# Patient Record
Sex: Female | Born: 1965
Health system: Southern US, Community
[De-identification: ages and names within clinical notes are randomized; demographics above are authoritative.]

## PROBLEM LIST (undated history)

## (undated) DIAGNOSIS — D649 Anemia, unspecified: Secondary | ICD-10-CM

## (undated) DIAGNOSIS — R519 Headache, unspecified: Secondary | ICD-10-CM

## (undated) DIAGNOSIS — R51 Headache: Secondary | ICD-10-CM

## (undated) DIAGNOSIS — R7303 Prediabetes: Secondary | ICD-10-CM

## (undated) DIAGNOSIS — Z86018 Personal history of other benign neoplasm: Secondary | ICD-10-CM

## (undated) DIAGNOSIS — F419 Anxiety disorder, unspecified: Secondary | ICD-10-CM

## (undated) DIAGNOSIS — Z800 Family history of malignant neoplasm of digestive organs: Secondary | ICD-10-CM

## (undated) DIAGNOSIS — M1990 Unspecified osteoarthritis, unspecified site: Secondary | ICD-10-CM

## (undated) DIAGNOSIS — I8390 Asymptomatic varicose veins of unspecified lower extremity: Secondary | ICD-10-CM

## (undated) DIAGNOSIS — I10 Essential (primary) hypertension: Secondary | ICD-10-CM

## (undated) DIAGNOSIS — E119 Type 2 diabetes mellitus without complications: Secondary | ICD-10-CM

## (undated) DIAGNOSIS — R002 Palpitations: Secondary | ICD-10-CM

## (undated) HISTORY — DX: Type 2 diabetes mellitus without complications: E11.9

## (undated) HISTORY — DX: Prediabetes: R73.03

## (undated) HISTORY — PX: ABLATION SAPHENOUS VEIN W/ RFA: SUR11

## (undated) HISTORY — DX: Personal history of other benign neoplasm: Z86.018

## (undated) HISTORY — DX: Palpitations: R00.2

## (undated) HISTORY — PX: OTHER SURGICAL HISTORY: SHX169

## (undated) HISTORY — DX: Family history of malignant neoplasm of digestive organs: Z80.0

---

## 1996-10-05 HISTORY — PX: TUBAL LIGATION: SHX77

## 1999-10-06 HISTORY — PX: MOUTH SURGERY: SHX715

## 2014-08-21 ENCOUNTER — Encounter: Payer: Self-pay | Admitting: Podiatry

## 2014-08-21 ENCOUNTER — Ambulatory Visit (INDEPENDENT_AMBULATORY_CARE_PROVIDER_SITE_OTHER): Payer: 59 | Admitting: Podiatry

## 2014-08-21 ENCOUNTER — Ambulatory Visit (INDEPENDENT_AMBULATORY_CARE_PROVIDER_SITE_OTHER): Payer: 59

## 2014-08-21 ENCOUNTER — Ambulatory Visit: Payer: Self-pay | Admitting: Nurse Practitioner

## 2014-08-21 VITALS — BP 142/89 | HR 71 | Resp 16 | Ht 65.0 in | Wt 198.0 lb

## 2014-08-21 DIAGNOSIS — M779 Enthesopathy, unspecified: Secondary | ICD-10-CM

## 2014-08-21 DIAGNOSIS — M76829 Posterior tibial tendinitis, unspecified leg: Secondary | ICD-10-CM

## 2014-08-21 DIAGNOSIS — M6789 Other specified disorders of synovium and tendon, multiple sites: Secondary | ICD-10-CM

## 2014-08-21 DIAGNOSIS — M19072 Primary osteoarthritis, left ankle and foot: Secondary | ICD-10-CM

## 2014-08-21 LAB — COMPREHENSIVE METABOLIC PANEL
ALBUMIN: 3.2 g/dL — AB (ref 3.4–5.0)
ALT: 21 U/L
ANION GAP: 4 — AB (ref 7–16)
Alkaline Phosphatase: 96 U/L
BILIRUBIN TOTAL: 0.3 mg/dL (ref 0.2–1.0)
BUN: 12 mg/dL (ref 7–18)
CO2: 31 mmol/L (ref 21–32)
CREATININE: 0.77 mg/dL (ref 0.60–1.30)
Calcium, Total: 8.4 mg/dL — ABNORMAL LOW (ref 8.5–10.1)
Chloride: 102 mmol/L (ref 98–107)
EGFR (African American): >60
EGFR (Non-African Amer.): >60
Glucose: 110 mg/dL — ABNORMAL HIGH (ref 65–99)
Osmolality: 274 (ref 275–301)
Potassium: 3.5 mmol/L (ref 3.5–5.1)
SGOT(AST): 22 U/L (ref 15–37)
Sodium: 137 mmol/L (ref 136–145)
Total Protein: 6.9 g/dL (ref 6.4–8.2)

## 2014-08-21 LAB — CBC WITH DIFFERENTIAL/PLATELET
BASOS ABS: 0 10*3/uL (ref 0.0–0.1)
Basophil %: 0.8 %
EOS ABS: 0.5 10*3/uL (ref 0.0–0.7)
Eosinophil %: 9.1 %
HCT: 35.3 % (ref 35.0–47.0)
HGB: 11 g/dL — ABNORMAL LOW (ref 12.0–16.0)
LYMPHS ABS: 1.9 10*3/uL (ref 1.0–3.6)
Lymphocyte %: 36.9 %
MCH: 21.7 pg — AB (ref 26.0–34.0)
MCHC: 31.3 g/dL — ABNORMAL LOW (ref 32.0–36.0)
MCV: 69 fL — ABNORMAL LOW (ref 80–100)
MONOS PCT: 13.7 %
Monocyte #: 0.7 x10 3/mm (ref 0.2–0.9)
Neutrophil #: 2 10*3/uL (ref 1.4–6.5)
Neutrophil %: 39.5 %
PLATELETS: 426 10*3/uL (ref 150–440)
RBC: 5.1 10*6/uL (ref 3.80–5.20)
RDW: 16.8 % — ABNORMAL HIGH (ref 11.5–14.5)
WBC: 5.1 10*3/uL (ref 3.6–11.0)

## 2014-08-21 LAB — LIPID PANEL
CHOLESTEROL: 195 mg/dL (ref 0–200)
HDL Cholesterol: 56 mg/dL (ref 40–60)
Ldl Cholesterol, Calc: 117 mg/dL — ABNORMAL HIGH (ref 0–100)
TRIGLYCERIDES: 109 mg/dL (ref 0–200)
VLDL Cholesterol, Calc: 22 mg/dL (ref 5–40)

## 2014-08-21 LAB — TSH: THYROID STIMULATING HORM: 0.949 u[IU]/mL

## 2014-08-21 LAB — HEMOGLOBIN A1C: Hemoglobin A1C: 5.9 % (ref 4.2–6.3)

## 2014-08-21 MED ORDER — MELOXICAM 7.5 MG PO TABS: 7.5000 mg | ORAL_TABLET | Freq: Every day | ORAL | Status: DC

## 2014-08-21 NOTE — Progress Notes (Addendum)
   Subjective:    Patient ID: Natasha Mitchell, female    DOB: Mar 06, 1966, 48 y.o.   MRN: [id number]  HPI Comments: 48 year old female presents the office they with complaints of left foot pain and swelling which has been ongoing since 1998 has been intermittent. She states that she has a lot of pain in her foot particularly after exercising or while working. She is a Quarry manager for [employer]. She states that after sitting for periods of time she will have discomfort and cramping into the inside aspect of her ankle. She's been taking Advil, using ice, and using over-the-counter inserts without much resolution in symptoms. No other complaints at this time. Denies any recent injury or trauma to the area.   Foot Pain      Review of Systems  Cardiovascular: Positive for palpitations.  Musculoskeletal:          All other systems reviewed and are negative.      Objective:   Physical Exam Objective: AAO x3, NAD DP/PT pulses palpable bilaterally, CRT less than 3 seconds Protective sensation intact with Simms Weinstein monofilament, vibratory sensation intact, Achilles tendon reflex intact There is tenderness along the course of the posterior tibial tendon mostly inferior to the medial malleolus and along its insertion into the navicular. The patient is able to perform a double arise however she is unable to perform a single heel rise on the left foot. There is significant decrease in medial arch height upon weightbearing. Equinus present. No pain with range of motion of the ankle joint or subtalar joint and there is no crepitation. Pain with active plantarflexion inversion/eversion; MMT 4/5 in those but 5/5 overall. ROM WNL No calf pain with compression, swelling, warmth, erythema No open lesions        Assessment & Plan:  48 year old female with significant flatfoot deformity, posterior tibial tendon dysfunction/tendinitis. -X-rays were obtained and reviewed with the  patient. There is chronic appearing changes to the navicular tuberosity and midfoot arthritic changes. Evidence of flatfoot. -Discussed various treatments including immobilization in a cam boot due to the significant pain over the area as well as changes the navicular however the patient does not want to be in a cam boot at this time. Dispensed ankle brace at this time. -Ice to the area. -Mobic. Discussed side effects the medication and directed to stop immediately if any are to occur. She states that she's been taking anti-inflammatory medications without problems. She states that she can take Motrin however generic ibuprofen did give her rash she does continue to take it. -MRI left foot and ankle. -Follow-up after MRI. In the meantime, call the office with any questions, concerns, change in symptoms.   *Due to heart issues, I called and cancelled the Mobic prescription with the patient's pharmacy before the patient picked it up.

## 2014-08-22 ENCOUNTER — Telehealth: Payer: Self-pay | Admitting: *Deleted

## 2014-08-22 LAB — IRON AND TIBC
IRON SATURATION: 7 %
Iron Bind.Cap.(Total): 381 ug/dL (ref 250–450)
Iron: 27 ug/dL — ABNORMAL LOW (ref 50–170)
Unbound Iron-Bind.Cap.: 354 ug/dL

## 2014-08-22 LAB — FERRITIN: Ferritin (ARMC): 10 ng/mL (ref 8–388)

## 2014-08-22 NOTE — Telephone Encounter (Signed)
Please call the patient and inform her not to take the medication. Thanks.

## 2014-08-22 NOTE — Telephone Encounter (Signed)
Called and left message for pt not to take the mobic or any other anti-inflammatory medications at present time due to HTN per dr Jacqualyn Posey.

## 2014-08-22 NOTE — Telephone Encounter (Signed)
[facility] called states the pt did pick up the Mobic.

## 2014-08-22 NOTE — Telephone Encounter (Signed)
Called and left message for pt letting her know mri sch at [facility] [phone] 7:30 appt time arrival time 7:00.

## 2014-08-22 NOTE — Addendum Note (Signed)
Addended by: Celesta Gentile R on: 08/22/2014 07:55 AM   Modules accepted: Orders, Medications

## 2014-08-23 ENCOUNTER — Telehealth: Payer: Self-pay | Admitting: *Deleted

## 2014-08-23 NOTE — Telephone Encounter (Signed)
Dr. Jacqualyn Posey states tell pt not to take the Mobic.  I left a message at [phone] to stop the Mobic, and call our office with concerns.

## 2014-08-24 ENCOUNTER — Encounter: Payer: Self-pay | Admitting: *Deleted

## 2014-08-24 NOTE — Progress Notes (Signed)
Called [employer] spoke with Thi stated no precert was required.

## 2014-08-27 ENCOUNTER — Telehealth: Payer: Self-pay | Admitting: *Deleted

## 2014-08-27 NOTE — Telephone Encounter (Signed)
PT CALLED AND SAID SHE COULD NOT GO FOR HER MRI ON THE DATED I SCHEDULED. CALLED AND LEFT MESSAGE ASKING PT TO CALL [phone] TO R/S HER MRI.

## 2014-09-07 ENCOUNTER — Ambulatory Visit: Payer: Self-pay | Admitting: Podiatry

## 2014-09-07 ENCOUNTER — Telehealth: Payer: Self-pay | Admitting: *Deleted

## 2014-09-07 ENCOUNTER — Ambulatory Visit: Payer: Self-pay | Admitting: Nurse Practitioner

## 2014-09-07 NOTE — Telephone Encounter (Signed)
Diane from [facility] called and stated with ankle mri everything is covered almost to the toes. Pt states there is no problems with her toes so i did ok the mri to be just ankle and not the foot.

## 2014-09-11 ENCOUNTER — Telehealth: Payer: Self-pay | Admitting: Podiatry

## 2014-09-11 ENCOUNTER — Encounter: Payer: Self-pay | Admitting: Podiatry

## 2014-09-11 NOTE — Telephone Encounter (Signed)
LEFT MESSAGE FOR PATIENT TO CALL AND SCHEDULE APPT WITH DR.WAGONER

## 2014-09-18 ENCOUNTER — Other Ambulatory Visit: Payer: Self-pay | Admitting: Nurse Practitioner

## 2014-09-18 DIAGNOSIS — D259 Leiomyoma of uterus, unspecified: Secondary | ICD-10-CM

## 2014-09-20 ENCOUNTER — Ambulatory Visit (INDEPENDENT_AMBULATORY_CARE_PROVIDER_SITE_OTHER): Payer: 59 | Admitting: Podiatry

## 2014-09-20 VITALS — BP 148/88 | HR 89 | Resp 16

## 2014-09-20 DIAGNOSIS — M76822 Posterior tibial tendinitis, left leg: Secondary | ICD-10-CM

## 2014-09-20 DIAGNOSIS — M79672 Pain in left foot: Secondary | ICD-10-CM

## 2014-09-20 NOTE — Progress Notes (Signed)
Patient ID: Natasha Mitchell, female   DOB: 10/31/1965, 48 y.o.   MRN: [id number]  Subjective: 48 year old female returns the office today to discuss MRI results and for follow-up evaluation of left foot posterior tibial tendinitis/dysfunction. The patient states that since last appointment the swelling has decreased as well as the pain overlying the inside aspect of her left ankle. She is continuing to wear the ankle brace. She has been taking OTC advil as needed. She denies any recent injury or trauma to the area. No acute changes his last appointment and no other complaints at this time. Denies any systemic complaints as fevers, chills, nausea, vomiting.  Objective: AAO x3, NAD DP/PT pulses palpable bilaterally, CRT less than 3 seconds Protective sensation intact with Simms Weinstein monofilament, vibratory sensation intact, Achilles tendon reflex intact Mild tenderness over the course of the posterior tibial tendon along the insertion of the navicular although severely decreased compared to prior appointment. There is trace edema overlying the area and again significantly improved compared to prior. At this time the patient is able to perform a single and double heel rise. She was unable to do this the last appointment. MMT 5/5, ROM WNL No pain with calf compression, swelling, warmth, erythema. No open lesions or pre-ulcerative lesions.  Assessment: 48 year old female with left foot posterior tibial tendinitis  Plan: -MRI was reviewed with the patient which revealed mild degenerative changes of the dorsal talonavicular joint, mild changes of the second tarsometatarsal joint, mild tibialis posterior  T no synovitis. - reatment options were discussed the patient including alternatives, risks, complications. -At this time discussed with the patient possible custom orthotics to help support her foot type and to help not having to wear the brace all the time. At this time the patient elects to  proceed with custom molded orthotics. The patient was scanned for orthotics to be sent to [facility] labs. -Continue ice to the area. - Discussed stretching exercises to help strengthen the tendon. Discussed the patient if she has any increasing symptoms while performing his exercises to decrease her activity. -Follow-up once the orthotics arrive or sooner should any palms arise. In the meantime, call the office with any questions, concerns, change in symptoms.

## 2014-09-20 NOTE — Patient Instructions (Signed)
Posterior Tibial Tendon Tendinitis with Rehab Tendonitis is a condition that is characterized by inflammation of a tendon or the lining (sheath) that surrounds it. The inflammation is usually caused by damage to the tendon, such as a tendon tear (strain). Sprains are classified into three categories. Grade 1 sprains cause pain, but the tendon is not lengthened. Grade 2 sprains include a lengthened ligament due to the ligament being stretched or partially ruptured. With grade 2 sprains there is still function, although the function may be diminished. Grade 3 sprains are characterized by a complete tear of the tendon or muscle, and function is usually impaired. Posterior tibialis tendonitis is tendonitis of the posterior tibial tendon, which attaches muscles of the lower leg to the foot. The posterior tibial tendon is located in the back of the ankle and helps the body straighten (plantar flex) and rotate inward (medially rotate) the ankle. SYMPTOMS   Pain, tenderness, swelling, warmth, and/or redness over the back of the inner ankle at the posterior tibial tendon or the inner part of the mid-foot.  Pain that worsens with plantar flexion or medial rotation of the ankle.  A crackling sound (crepitation) when the tendon is moved or touched. CAUSES  Posterior tibial tendonitis occurs when damage to the posterior tibial tendon starts an inflammatory response. Common mechanisms of injury include:  Degenerative (occurs with aging) processes that weaken the tendon and make it more susceptible to injury.  Stress placed on the tendon from an increase in the intensity, frequency, or duration of training.  Direct trauma to the ankle.  Returning to activity before a previous ankle injury is allowed to heal. RISK INCREASES WITH:  Activities that involve repetitive and/or stressful plantar flexion (jumping, kicking, or running up/down hills).  Poor strength and flexibility.  Flat feet.  Previous injury  to the foot, ankle, or leg. PREVENTION   Warm up and stretch properly before activity.  Allow for adequate recovery between workouts.  Maintain physical fitness:  Strength, flexibility, and endurance.  Cardiovascular fitness.  Learn and use proper technique. When possible, have a coach correct improper technique.  Complete rehabilitation from a previous foot, ankle, or leg injury.  If you have flat feet, wear arch supports (orthotics). PROGNOSIS  If treated properly, the symptoms of tendonitis usually resolve within 6 weeks. This period may be shorter for injuries caused by direct trauma. RELATED COMPLICATIONS   Prolonged healing time, if improperly treated or reinjured.  Recurrent symptoms that result in a chronic problem.  Partial or complete tendon tear (rupture) requiring surgery. TREATMENT  Treatment initially involves the use of ice and medication to help reduce pain and inflammation. The use of strengthening and stretching exercises may help reduce pain with activity. These exercises may be performed at home or with referral to a therapist. Often times, your caregiver will recommend immobilizing the ankle to allow the tendon to heal. If you have flat feet, you may be advised to wear orthotic arch supports. If symptoms persist for greater than 6 months despite nonsurgical (conservative) treatment, then surgery may be recommended. MEDICATION   If pain medication is necessary, then nonsteroidal anti-inflammatory medications, such as aspirin and ibuprofen, or other minor pain relievers, such as acetaminophen, are often recommended.  Do not take pain medication for 7 days before surgery.  Prescription pain relievers may be given if deemed necessary by your caregiver. Use only as directed and only as much as you need.  Corticosteroid injections may be given by your caregiver. These injections should  be reserved for the most serious cases because they may only be given a certain  number of times. HEAT AND COLD  Cold treatment (icing) relieves pain and reduces inflammation. Cold treatment should be applied for 10 to 15 minutes every 2 to 3 hours for inflammation and pain and immediately after any activity that aggravates your symptoms. Use ice packs or massage the area with a piece of ice (ice massage).  Heat treatment may be used prior to performing the stretching and strengthening activities prescribed by your caregiver, physical therapist, or athletic trainer. Use a heat pack or soak the injury in warm water. SEEK MEDICAL CARE IF:  Treatment seems to offer no benefit, or the condition worsens.  Any medications produce adverse side effects. EXERCISES RANGE OF MOTION (ROM) AND STRETCHING EXERCISES - Posterior Tibial Tendon Tendinitis These exercises may help you when beginning to rehabilitate your injury. Your symptoms may resolve with or without further involvement from your physician, physical therapist or athletic trainer. While completing these exercises, remember:   Restoring tissue flexibility helps normal motion to return to the joints. This allows healthier, less painful movement and activity.  An effective stretch should be held for at least 30 seconds.  A stretch should never be painful. You should only feel a gentle lengthening or release in the stretched tissue. RANGE OF MOTION - Ankle Plantar Flexion   Sit with your right / left leg crossed over your opposite knee.  Use your opposite hand to pull the top of your foot and toes toward you.  You should feel a gentle stretch on the top of your foot/ankle. Hold this position for __________ seconds. Repeat __________ times. Complete this exercise __________ times per day.  RANGE OF MOTION - Ankle Eversion   Sit with your right / left ankle crossed over your opposite knee.  Grip your foot with your opposite hand, placing your thumb on the top of your foot and your fingers across the bottom of your  foot.  Gently push your foot downward with a slight rotation so your littlest toes rise slightly.  You should feel a gentle stretch on the inside of your ankle. Hold the stretch for __________ seconds. Repeat __________ times. Complete this exercise __________ times per day.  RANGE OF MOTION - Ankle Inversion   Sit with your right / left ankle crossed over your opposite knee.  Grip your foot with your opposite hand, placing your thumb on the bottom of your foot and your fingers across the top of your foot.  Gently pull your foot so the smallest toe comes toward you and your thumb pushes the inside of the ball of your foot away from you.  You should feel a gentle stretch on the outside of your ankle. Hold the stretch for __________ seconds. Repeat __________ times. Complete this exercise __________ times per day.  RANGE OF MOTION - Dorsi/Plantar Flexion  While sitting with your right / left knee straight, draw the top of your foot upward by flexing your ankle. Then reverse the motion, pointing your toes downward.  Hold each position for __________ seconds.  After completing your first set of exercises, repeat this exercise with your knee bent. Repeat __________ times. Complete this exercise __________ times per day.  RANGE OF MOTION - Ankle Alphabet  Imagine your right / left big toe is a pen.  Keeping your hip and knee still, write out the entire alphabet with your "pen." Make the letters as large as you can without   increasing any discomfort. Repeat __________ times. Complete this exercise __________ times per day.  STRETCH - Gastrocsoleus   Sit with your right / left leg extended. Holding onto both ends of a belt or towel, loop it around the ball of your foot.  Keeping your right / left ankle and foot relaxed and your knee straight, pull your foot and ankle toward you using the belt/towel.  You should feel a gentle stretch behind your calf or knee. Hold this position for  __________ seconds. Repeat __________ times. Complete this exercise __________ times per day.  STRETCH - Gastroc, Standing   Place hands on wall.  Extend right / left leg, keeping the front knee somewhat bent.  Slightly point your toes inward on your back foot.  Keeping your right / left heel on the floor and your knee straight, shift your weight toward the wall, not allowing your back to arch.  You should feel a gentle stretch in the right / left calf. Hold this position for __________ seconds. Repeat __________ times. Complete this stretch __________ times per day. STRETCH - Soleus, Standing   Place hands on wall.  Extend right / left leg, keeping the other knee somewhat bent.  Slightly point your toes inward on your back foot.  Keep your right / left heel on the floor, bend your back knee, and slightly shift your weight over the back leg so that you feel a gentle stretch deep in your back calf.  Hold this position for __________ seconds. Repeat __________ times. Complete this stretch __________ times per day. STRENGTHENING EXERCISES - Posterior Tibial Tendon Tendinitis These exercises may help you when beginning to rehabilitate your injury. They may resolve your symptoms with or without further involvement from your physician, physical therapist, or athletic trainer. While completing these exercises, remember:   Muscles can gain both the endurance and the strength needed for everyday activities through controlled exercises.  Complete these exercises as instructed by your physician, physical therapist, or athletic trainer. Progress the resistance and repetitions only as guided. STRENGTH - Dorsiflexors  Secure a rubber exercise band/tubing to a fixed object (i.e., table, pole) and loop the other end around your right / left foot.  Sit on the floor facing the fixed object. The band/tubing should be slightly tense when your foot is relaxed.  Slowly draw your foot back toward you  using your ankle and toes.  Hold this position for __________ seconds. Slowly release the tension in the band and return your foot to the starting position. Repeat __________ times. Complete this exercise __________ times per day.  STRENGTH - Towel Curls  Sit in a chair positioned on a non-carpeted surface.  Place your foot on a towel, keeping your heel on the floor.  Pull the towel toward your heel by only curling your toes. Keep your heel on the floor.  If instructed by your physician, physical therapist, or athletic trainer, add ____________________ at the end of the towel. Repeat __________ times. Complete this exercise __________ times per day. STRENGTH - Ankle Eversion   Secure one end of a rubber exercise band/tubing to a fixed object (table, pole). Loop the other end around your foot just before your toes.  Place your fists between your knees. This will focus your strengthening at your ankle.  Drawing the band/tubing across your opposite foot, slowly pull your little toe out and up. Make sure the band/tubing is positioned to resist the entire motion.  Hold this position for __________ seconds.  Have   your muscles resist the band/tubing as it slowly pulls your foot back to the starting position. Repeat __________ times. Complete this exercise __________ times per day.  STRENGTH - Ankle Inversion   Secure one end of a rubber exercise band/tubing to a fixed object (table, pole). Loop the other end around your foot just before your toes.  Place your fists between your knees. This will focus your strengthening at your ankle.  Slowly, pull your big toe up and in, making sure the band/tubing is positioned to resist the entire motion.  Hold this position for __________ seconds.  Have your muscles resist the band/tubing as it slowly pulls your foot back to the starting position. Repeat __________ times. Complete this exercises __________ times per day.  Document Released:  09/21/2005 Document Revised: 02/05/2014 Document Reviewed: 01/03/2009 [employer] Patient Information 2015 [employer]. This information is not intended to replace advice given to you by your health care provider. Make sure you discuss any questions you have with your health care provider.  

## 2014-10-01 ENCOUNTER — Other Ambulatory Visit: Payer: Self-pay

## 2014-10-05 DIAGNOSIS — I8390 Asymptomatic varicose veins of unspecified lower extremity: Secondary | ICD-10-CM

## 2014-10-05 HISTORY — DX: Asymptomatic varicose veins of unspecified lower extremity: I83.90

## 2014-10-23 ENCOUNTER — Ambulatory Visit: Payer: 59 | Admitting: Podiatry

## 2015-06-18 ENCOUNTER — Ambulatory Visit (INDEPENDENT_AMBULATORY_CARE_PROVIDER_SITE_OTHER): Payer: 59 | Admitting: Family Medicine

## 2015-06-18 ENCOUNTER — Encounter: Payer: Self-pay | Admitting: Family Medicine

## 2015-06-18 VITALS — BP 132/80 | HR 94 | Temp 98.1°F | Resp 16 | Ht 65.0 in | Wt 191.8 lb

## 2015-06-18 DIAGNOSIS — R002 Palpitations: Secondary | ICD-10-CM

## 2015-06-18 DIAGNOSIS — N921 Excessive and frequent menstruation with irregular cycle: Secondary | ICD-10-CM | POA: Diagnosis not present

## 2015-06-18 DIAGNOSIS — E669 Obesity, unspecified: Secondary | ICD-10-CM

## 2015-06-18 DIAGNOSIS — F3341 Major depressive disorder, recurrent, in partial remission: Secondary | ICD-10-CM

## 2015-06-18 DIAGNOSIS — I10 Essential (primary) hypertension: Secondary | ICD-10-CM

## 2015-06-18 DIAGNOSIS — E66811 Obesity, class 1: Secondary | ICD-10-CM

## 2015-06-18 DIAGNOSIS — N951 Menopausal and female climacteric states: Secondary | ICD-10-CM

## 2015-06-18 DIAGNOSIS — R232 Flushing: Secondary | ICD-10-CM

## 2015-06-18 MED ORDER — HYDROCHLOROTHIAZIDE 25 MG PO TABS: 25.0000 mg | ORAL_TABLET | Freq: Every day | ORAL | Status: DC

## 2015-06-18 MED ORDER — VERAPAMIL HCL 180 MG (CO) PO TB24: 180.0000 mg | ORAL_TABLET | Freq: Every day | ORAL | Status: DC

## 2015-06-18 NOTE — Progress Notes (Signed)
Name: Natasha Mitchell   MRN: [id number]    DOB: 1966/08/13   Date:06/18/2015       Progress Note  Subjective  Chief Complaint  Chief Complaint  Patient presents with  . Establish Care  . Medication Refill  . Labs Only    HPI  Natasha Mitchell is a 49 year old female here to establish care and discuss ongoing medical needs. She was first diagnosed with heart palpitation is 2001, started on Verapamil but has not seen a Cardiologist. She denies diagnosis of arythmias. Her palpitations have been well controled since starting Verapamil and notes only occasional heart palpitation symptoms not associated with chest pain, SOB, dizziness, pre-syncope, dyspnea.  To decrease her cardiac risk factors she is engaging in regular exercise and balanced diet. Her maximum weight in the past was 242 lbs and she is down 50 lbs now and her goal weight is 170-180 lbs for now. [name] reports Irregular menses with hot flashes, possible uterine fibroids. Transvaginal and abdominal US done 09/2014 at [place] [facility] but results unknown. Denies pelvic pain, pregnancy, vaginal discharge. Patient complains of depression. She complains of depressed mood and fatigue. Onset was approximately several months ago, stable since that time.  She denies current suicidal and homicidal plan or intent.   Family history significant for anxiety and depression.Possible organic causes contributing are: none.  Risk factors: previous episode of depression Previous treatment includes Celexa and individual therapy.  Not quite ready to start back up on medications.   Patient Active Problem List   Diagnosis Date Noted  . Hypertension goal BP (blood pressure) < 140/90 06/18/2015  . Obesity, Class I, BMI 30-34.9 06/18/2015  . Menorrhagia with regular cycle 06/18/2015    Social History  Substance Use Topics  . Smoking status: Former Research scientist (life sciences)  . Smokeless tobacco: Not on file     Comment: quit in 2001  . Alcohol Use: 0.0 oz/week    0  Standard drinks or equivalent per week     Comment: occasional beer     Current outpatient prescriptions:  .  hydrochlorothiazide (HYDRODIURIL) 25 MG tablet, Take 25 mg by mouth daily., Disp: , Rfl:  .  verapamil (COVERA HS) 180 MG (CO) 24 hr tablet, Take 180 mg by mouth at bedtime., Disp: , Rfl:   Past Surgical History  Procedure Laterality Date  . Tubal ligation  1998  . Mouth surgery  2001  . Ablation saphenous vein w/ rfa Bilateral     femoral vein ([facility])    Family History  Problem Relation Age of Onset  . Birth defects Mother     ablation in heart  . Hearing loss Father     Allergies  Allergen Reactions  . Latex Rash  . Motrin [Ibuprofen] Rash     Review of Systems  CONSTITUTIONAL: No fever, chills, weakness. Yes fatigue.  HEENT:  - Eyes: No visual changes.  - Ears: No auditory changes. No pain.  - Nose: No sneezing, congestion, runny nose. - Throat: No sore throat. No changes in swallowing. SKIN: No rash or itching.  CARDIOVASCULAR: No chest pain, chest pressure or chest discomfort. No edema.  RESPIRATORY: No shortness of breath, cough or sputum.  GASTROINTESTINAL: No anorexia, nausea, vomiting. No changes in bowel habits. No abdominal pain or blood.  GENITOURINARY: No dysuria. No frequency. No discharge.  NEUROLOGICAL: No headache, dizziness, syncope, paralysis, ataxia, numbness or tingling in the extremities. No memory changes. No change in bowel or bladder control.  MUSCULOSKELETAL:  No joint pain. No muscle pain. HEMATOLOGIC: No anemia, bleeding or bruising.  LYMPHATICS: No enlarged lymph nodes.  PSYCHIATRIC: Yes change in mood. No change in sleep pattern.  ENDOCRINOLOGIC: Yes reports of sweating, cold or heat intolerance. No polyuria or polydipsia.     Objective  BP 132/80 mmHg  Pulse 94  Temp(Src) 98.1 F (36.7 C) (Oral)  Resp 16  Ht 5\' 5"  (1.651 m)  Wt 191 lb 12.8 oz (87 kg)  BMI 31.92 kg/m2  SpO2 99%  LMP  05/31/2015 (Approximate) Body mass index is 31.92 kg/(m^2).  Physical Exam  Constitutional: Patient overweight and well-nourished. In no distress.  HEENT:  - Head: Normocephalic and atraumatic.  - Ears: Bilateral TMs gray, no erythema or effusion - Nose: Nasal mucosa moist - Mouth/Throat: Oropharynx is clear and moist. No tonsillar hypertrophy or erythema. No post nasal drainage.  - Eyes: Conjunctivae clear, EOM movements normal. PERRLA. No scleral icterus.  Neck: Normal range of motion. Neck supple. No JVD present. No thyromegaly present.  Cardiovascular: Normal rate, regular rhythm and normal heart sounds.  No murmur heard.  Pulmonary/Chest: Effort normal and breath sounds normal. No respiratory distress. Musculoskeletal: Normal range of motion bilateral UE and LE, no joint effusions. Peripheral vascular: Bilateral LE no edema. Neurological: CN II-XII grossly intact with no focal deficits. Alert and oriented to person, place, and time. Coordination, balance, strength, speech and gait are normal.  Skin: Skin is warm and dry. No rash noted. No erythema.  Psychiatric: Patient has a normal mood and affect. Behavior is normal in office today. Judgment and thought content normal in office today.  Assessment & Plan  1. Hypertension goal BP (blood pressure) < 140/90 Clinically stable findings based on clinical exam and on review of any pertinent results. Recommended to patient that they continue their current regimen with regular follow ups.  - CBC with Differential/Platelet - Comprehensive metabolic panel - Hemoglobin A1c - Lipid panel - TSH - Magnesium - hydrochlorothiazide (HYDRODIURIL) 25 MG tablet; Take 1 tablet (25 mg total) by mouth daily.  Dispense: 90 tablet; Refill: 3 - verapamil (COVERA HS) 180 MG (CO) 24 hr tablet; Take 1 tablet (180 mg total) by mouth at bedtime.  Dispense: 90 tablet; Refill: 3  2. Obesity, Class I, BMI 30-34.9 The patient has been counseled on their higher  than normal BMI but she has already made great strides with weight loss.  They have verbally expressed understanding their increased risk for other diseases.  In efforts to meet a better target BMI goal the patient has been counseled on lifestyle, diet and exercise modification tactics. Start with moderate intensity aerobic exercise (walking, jogging, elliptical, swimming, group or individual sports, hiking) at least 13mins a day at least 4 days a week and increase intensity, duration, frequency as tolerated. Diet should include well balance fresh fruits and vegetables avoiding processed foods, carbohydrates and sugars. Drink at least 8oz 10 glasses a day avoiding sodas, sugary fruit drinks, sweetened tea. Check weight on a reliable scale daily and monitor weight loss progress daily. Consider investing in mobile phone apps that will help keep track of weight loss goals.  - Lipid panel  3. Menorrhagia with irregular cycle May be approaching menopause. If menses becomes heavy or signs of anemia will get repeat pelvic imaging.  - Follicle stimulating hormone - Luteinizing hormone  4. Depression, major, recurrent, in partial remission Fairly stable but she is not interested in medication therapy at this time.  - TSH  5. Hot flashes  May be approaching menopause.  - Follicle stimulating hormone - Luteinizing hormone  6. Heart palpitations Stable clinical picture. Will get EKG during CPE.  - Lipid panel - TSH - T3, free - T4, free - Magnesium

## 2015-06-19 ENCOUNTER — Encounter: Payer: Self-pay | Admitting: Family Medicine

## 2015-06-19 LAB — COMPREHENSIVE METABOLIC PANEL
ALK PHOS: 96 IU/L (ref 39–117)
ALT: 14 IU/L (ref 0–32)
AST: 22 IU/L (ref 0–40)
Albumin/Globulin Ratio: 1.3 (ref 1.1–2.5)
Albumin: 4 g/dL (ref 3.5–5.5)
BILIRUBIN TOTAL: 0.3 mg/dL (ref 0.0–1.2)
BUN/Creatinine Ratio: 29 — ABNORMAL HIGH (ref 9–23)
BUN: 20 mg/dL (ref 6–24)
CO2: 26 mmol/L (ref 18–29)
CREATININE: 0.7 mg/dL (ref 0.57–1.00)
Calcium: 9.4 mg/dL (ref 8.7–10.2)
Chloride: 100 mmol/L (ref 97–108)
GFR calc Af Amer: 118 mL/min/{1.73_m2} (ref >59)
GFR calc non Af Amer: 103 mL/min/{1.73_m2} (ref >59)
GLUCOSE: 99 mg/dL (ref 65–99)
Globulin, Total: 3 g/dL (ref 1.5–4.5)
Potassium: 4.2 mmol/L (ref 3.5–5.2)
Sodium: 138 mmol/L (ref 134–144)
Total Protein: 7 g/dL (ref 6.0–8.5)

## 2015-06-19 LAB — HEMOGLOBIN A1C
Est. average glucose Bld gHb Est-mCnc: 131 mg/dL
HEMOGLOBIN A1C: 6.2 % — AB (ref 4.8–5.6)

## 2015-06-19 LAB — LIPID PANEL
CHOL/HDL RATIO: 3 ratio (ref 0.0–4.4)
CHOLESTEROL TOTAL: 188 mg/dL (ref 100–199)
HDL: 62 mg/dL (ref >39)
LDL Calculated: 105 mg/dL — ABNORMAL HIGH (ref 0–99)
TRIGLYCERIDES: 107 mg/dL (ref 0–149)
VLDL Cholesterol Cal: 21 mg/dL (ref 5–40)

## 2015-06-19 LAB — CBC WITH DIFFERENTIAL/PLATELET
BASOS ABS: 0.1 10*3/uL (ref 0.0–0.2)
Basos: 1 %
EOS (ABSOLUTE): 0.4 10*3/uL (ref 0.0–0.4)
Eos: 6 %
Hematocrit: 39 % (ref 34.0–46.6)
Hemoglobin: 12.4 g/dL (ref 11.1–15.9)
Immature Grans (Abs): 0 10*3/uL (ref 0.0–0.1)
Immature Granulocytes: 0 %
LYMPHS ABS: 2.1 10*3/uL (ref 0.7–3.1)
LYMPHS: 34 %
MCH: 22.6 pg — ABNORMAL LOW (ref 26.6–33.0)
MCHC: 31.8 g/dL (ref 31.5–35.7)
MCV: 71 fL — ABNORMAL LOW (ref 79–97)
MONOCYTES: 10 %
Monocytes Absolute: 0.6 10*3/uL (ref 0.1–0.9)
NEUTROS ABS: 3 10*3/uL (ref 1.4–7.0)
Neutrophils: 49 %
Platelets: 491 10*3/uL — ABNORMAL HIGH (ref 150–379)
RBC: 5.49 x10E6/uL — AB (ref 3.77–5.28)
RDW: 17 % — ABNORMAL HIGH (ref 12.3–15.4)
WBC: 6.1 10*3/uL (ref 3.4–10.8)

## 2015-06-19 LAB — T3, FREE: T3, Free: 2.8 pg/mL (ref 2.0–4.4)

## 2015-06-19 LAB — MAGNESIUM: MAGNESIUM: 2.2 mg/dL (ref 1.6–2.3)

## 2015-06-19 LAB — TSH: TSH: 0.705 u[IU]/mL (ref 0.450–4.500)

## 2015-06-19 LAB — T4, FREE: FREE T4: 1.02 ng/dL (ref 0.82–1.77)

## 2015-06-19 LAB — LUTEINIZING HORMONE: LH: 1.5 m[IU]/mL

## 2015-06-19 LAB — FOLLICLE STIMULATING HORMONE: FSH: 2.1 m[IU]/mL

## 2015-06-20 ENCOUNTER — Other Ambulatory Visit: Payer: Self-pay | Admitting: Family Medicine

## 2015-06-20 DIAGNOSIS — Z1231 Encounter for screening mammogram for malignant neoplasm of breast: Secondary | ICD-10-CM

## 2015-06-20 DIAGNOSIS — F3341 Major depressive disorder, recurrent, in partial remission: Secondary | ICD-10-CM

## 2015-06-20 MED ORDER — CITALOPRAM HYDROBROMIDE 20 MG PO TABS: 20.0000 mg | ORAL_TABLET | Freq: Every day | ORAL | Status: DC

## 2015-06-21 ENCOUNTER — Telehealth: Payer: Self-pay | Admitting: Family Medicine

## 2015-06-21 NOTE — Telephone Encounter (Signed)
PT RETURNED YOUR CALL ABOUT RESULTS

## 2015-07-18 ENCOUNTER — Encounter: Payer: Self-pay | Admitting: Family Medicine

## 2015-07-18 ENCOUNTER — Ambulatory Visit (INDEPENDENT_AMBULATORY_CARE_PROVIDER_SITE_OTHER): Payer: 59 | Admitting: Family Medicine

## 2015-07-18 VITALS — BP 130/78 | HR 68 | Temp 98.7°F | Resp 16 | Wt 191.5 lb

## 2015-07-18 DIAGNOSIS — Z111 Encounter for screening for respiratory tuberculosis: Secondary | ICD-10-CM

## 2015-07-18 DIAGNOSIS — Z0000 Encounter for general adult medical examination without abnormal findings: Secondary | ICD-10-CM | POA: Diagnosis not present

## 2015-07-18 DIAGNOSIS — Z113 Encounter for screening for infections with a predominantly sexual mode of transmission: Secondary | ICD-10-CM | POA: Diagnosis not present

## 2015-07-18 DIAGNOSIS — Z23 Encounter for immunization: Secondary | ICD-10-CM

## 2015-07-18 DIAGNOSIS — Z1231 Encounter for screening mammogram for malignant neoplasm of breast: Secondary | ICD-10-CM | POA: Diagnosis not present

## 2015-07-18 DIAGNOSIS — D259 Leiomyoma of uterus, unspecified: Secondary | ICD-10-CM

## 2015-07-18 DIAGNOSIS — R002 Palpitations: Secondary | ICD-10-CM | POA: Diagnosis not present

## 2015-07-18 DIAGNOSIS — Z1211 Encounter for screening for malignant neoplasm of colon: Secondary | ICD-10-CM | POA: Diagnosis not present

## 2015-07-18 DIAGNOSIS — Z124 Encounter for screening for malignant neoplasm of cervix: Secondary | ICD-10-CM | POA: Diagnosis not present

## 2015-07-18 NOTE — Progress Notes (Signed)
Name: Natasha Mitchell   MRN: [id number]    DOB: 08/23/1966   Date:07/18/2015       Progress Note  Subjective  Chief Complaint  Chief Complaint  Patient presents with  . Annual Exam    HPI  Patient is here today for a Complete Female Physical Exam:  The patient has no unusual complaints. No overt palpitations for some time now. Back on Celexa but using 1/2 the dose and notes improved mood. Still waiting on results of pelvic imaging regarding fibroid uterus done 09/2014. No heavy bleeding or pelvic pain noted. Overall feels healthy. Diet is well balanced. In general does exercise regularly. Sees dentist regularly and addresses vision concerns with ophthalmologist if applicable. In regards to sexual activity the patient is currently sexually active. Currently is concerned about exposure to any STDs.   Menstrual history is irregular.   Sister at age 38 years old had colon polyps discovered, she is requesting early C-scope and mammogram. Needs TB testing for work.   Past Medical History  Diagnosis Date  . Intermittent palpitations   . Varicose veins of legs     Past Surgical History  Procedure Laterality Date  . Tubal ligation  1998  . Mouth surgery  2001  . Ablation saphenous vein w/ rfa Bilateral     femoral vein ([facility])    Family History  Problem Relation Age of Onset  . Birth defects Mother     ablation in heart  . Hearing loss Father     Social History   Social History  . Marital Status: Single    Spouse Name: N/A  . Number of Children: N/A  . Years of Education: N/A   Occupational History  . Not on file.   Social History Main Topics  . Smoking status: Former Research scientist (life sciences)  . Smokeless tobacco: Not on file     Comment: quit in 2001  . Alcohol Use: 0.0 oz/week    0 Standard drinks or equivalent per week     Comment: occasional beer  . Drug Use: No  . Sexual Activity: No   Other Topics Concern  . Not on file   Social History  Narrative     Current outpatient prescriptions:  .  citalopram (CELEXA) 20 MG tablet, Take 1 tablet (20 mg total) by mouth daily., Disp: 30 tablet, Rfl: 3 .  hydrochlorothiazide (HYDRODIURIL) 25 MG tablet, Take 1 tablet (25 mg total) by mouth daily., Disp: 90 tablet, Rfl: 3 .  verapamil (COVERA HS) 180 MG (CO) 24 hr tablet, Take 1 tablet (180 mg total) by mouth at bedtime., Disp: 90 tablet, Rfl: 3  Allergies  Allergen Reactions  . Latex Rash  . Motrin [Ibuprofen] Rash    ROS  CONSTITUTIONAL: No significant weight changes, fever, chills, weakness or fatigue.  HEENT:  - Eyes: No visual changes.  - Ears: No auditory changes. No pain.  - Nose: No sneezing, congestion, runny nose. - Throat: No sore throat. No changes in swallowing. SKIN: No rash or itching.  CARDIOVASCULAR: No chest pain, chest pressure or chest discomfort. No palpitations or edema.  RESPIRATORY: No shortness of breath, cough or sputum.  GASTROINTESTINAL: No anorexia, nausea, vomiting. No changes in bowel habits. No abdominal pain or blood.  GENITOURINARY: No dysuria. No frequency. No discharge.  NEUROLOGICAL: No headache, dizziness, syncope, paralysis, ataxia, numbness or tingling in the extremities. No memory changes. No change in bowel or bladder control.  MUSCULOSKELETAL: No joint pain. No muscle pain.  HEMATOLOGIC: No anemia, bleeding or bruising.  LYMPHATICS: No enlarged lymph nodes.  PSYCHIATRIC: No change in mood. No change in sleep pattern.  ENDOCRINOLOGIC: No reports of sweating, cold or heat intolerance. No polyuria or polydipsia.   Objective  Filed Vitals:   07/18/15 1507  BP: 130/78  Pulse: 68  Temp: 98.7 F (37.1 C)  TempSrc: Oral  Resp: 16  Weight: 191 lb 8 oz (86.864 kg)  SpO2: 99%   Body mass index is 31.87 kg/(m^2).  Depression screen [facility] 2/9 06/18/2015  Decreased Interest 0  Down, Depressed, Hopeless 1  PHQ - 2 Score 1    Recent Results (from the past 2160 hour(s))  CBC with  Differential/Platelet     Status: Abnormal   Collection Time: 06/18/15 10:40 AM  Result Value Ref Range   WBC 6.1 3.4 - 10.8 x10E3/uL   RBC 5.49 (H) 3.77 - 5.28 x10E6/uL   Hemoglobin 12.4 11.1 - 15.9 g/dL   Hematocrit 39.0 34.0 - 46.6 %   MCV 71 (L) 79 - 97 fL   MCH 22.6 (L) 26.6 - 33.0 pg   MCHC 31.8 31.5 - 35.7 g/dL   RDW 17.0 (H) 12.3 - 15.4 %   Platelets 491 (H) 150 - 379 x10E3/uL   Neutrophils 49 %   Lymphs 34 %   Monocytes 10 %   Eos 6 %   Basos 1 %   Neutrophils Absolute 3.0 1.4 - 7.0 x10E3/uL   Lymphocytes Absolute 2.1 0.7 - 3.1 x10E3/uL   Monocytes Absolute 0.6 0.1 - 0.9 x10E3/uL   EOS (ABSOLUTE) 0.4 0.0 - 0.4 x10E3/uL   Basophils Absolute 0.1 0.0 - 0.2 x10E3/uL   Immature Granulocytes 0 %   Immature Grans (Abs) 0.0 0.0 - 0.1 x10E3/uL  Comprehensive metabolic panel     Status: Abnormal   Collection Time: 06/18/15 10:40 AM  Result Value Ref Range   Glucose 99 65 - 99 mg/dL   BUN 20 6 - 24 mg/dL   Creatinine, Ser 0.70 0.57 - 1.00 mg/dL   GFR calc non Af Amer 103 >59 mL/min/1.73   GFR calc Af Amer 118 >59 mL/min/1.73   BUN/Creatinine Ratio 29 (H) 9 - 23   Sodium 138 134 - 144 mmol/L   Potassium 4.2 3.5 - 5.2 mmol/L   Chloride 100 97 - 108 mmol/L   CO2 26 18 - 29 mmol/L   Calcium 9.4 8.7 - 10.2 mg/dL   Total Protein 7.0 6.0 - 8.5 g/dL   Albumin 4.0 3.5 - 5.5 g/dL   Globulin, Total 3.0 1.5 - 4.5 g/dL   Albumin/Globulin Ratio 1.3 1.1 - 2.5   Bilirubin Total 0.3 0.0 - 1.2 mg/dL   Alkaline Phosphatase 96 39 - 117 IU/L   AST 22 0 - 40 IU/L   ALT 14 0 - 32 IU/L  Hemoglobin A1c     Status: Abnormal   Collection Time: 06/18/15 10:40 AM  Result Value Ref Range   Hgb A1c MFr Bld 6.2 (H) 4.8 - 5.6 %    Comment:          Pre-diabetes: 5.7 - 6.4          Diabetes: >6.4          Glycemic control for adults with diabetes: <7.0    Est. average glucose Bld gHb Est-mCnc 131 mg/dL  Lipid panel     Status: Abnormal   Collection Time: 06/18/15 10:40 AM  Result Value Ref Range    Cholesterol, Total 188 100 -  199 mg/dL   Triglycerides 107 0 - 149 mg/dL   HDL 62 >39 mg/dL    Comment: According to ATP-III Guidelines, HDL-C >59 mg/dL is considered a negative risk factor for CHD.    VLDL Cholesterol Cal 21 5 - 40 mg/dL   LDL Calculated 105 (H) 0 - 99 mg/dL   Chol/HDL Ratio 3.0 0.0 - 4.4 ratio units    Comment:                                   T. Chol/HDL Ratio                                             Men  Women                               1/2 Avg.Risk  3.4    3.3                                   Avg.Risk  5.0    4.4                                2X Avg.Risk  9.6    7.1                                3X Avg.Risk 23.4   11.0   TSH     Status: None   Collection Time: 06/18/15 10:40 AM  Result Value Ref Range   TSH 0.705 0.450 - 4.500 uIU/mL  T3, free     Status: None   Collection Time: 06/18/15 10:40 AM  Result Value Ref Range   T3, Free 2.8 2.0 - 4.4 pg/mL  T4, free     Status: None   Collection Time: 06/18/15 10:40 AM  Result Value Ref Range   Free T4 1.02 0.82 - 1.77 ng/dL  Magnesium     Status: None   Collection Time: 06/18/15 10:40 AM  Result Value Ref Range   Magnesium 2.2 1.6 - 2.3 mg/dL  Follicle stimulating hormone     Status: None   Collection Time: 06/18/15 10:40 AM  Result Value Ref Range   FSH 2.1 mIU/mL    Comment:                     Follicular phase        3.5 -  12.5                     Ovulation phase         4.7 -  21.5                     Luteal phase            1.7 -   7.7                     Postmenopausal         25.8 - 134.8   Luteinizing hormone  Status: None   Collection Time: 06/18/15 10:40 AM  Result Value Ref Range   LH 1.5 mIU/mL    Comment:                     Follicular phase        2.4 -  12.6                     Ovulation phase        14.0 -  95.6                     Luteal phase            1.0 -  11.4                     Postmenopausal          7.7 -  58.5     Physical Exam  Constitutional: Patient  appears well-developed and well-nourished. In no distress.  HEENT:  - Head: Normocephalic and atraumatic.  - Ears: Bilateral TMs gray, no erythema or effusion - Nose: Nasal mucosa moist - Mouth/Throat: Oropharynx is clear and moist. No tonsillar hypertrophy or erythema. No post nasal drainage.  - Eyes: Conjunctivae clear, EOM movements normal. PERRLA. No scleral icterus.  Neck: Normal range of motion. Neck supple. No JVD present. No thyromegaly present.  Cardiovascular: Normal rate, regular rhythm and normal heart sounds.  No murmur heard.  Pulmonary/Chest: Effort normal and breath sounds normal. No respiratory distress. Abdominal: Soft. Bowel sounds are normal, no distension. There is no tenderness. no masses BREAST: Bilateral breast exam normal with no masses, skin changes or nipple discharge FEMALE GENITALIA:  External genitalia normal External urethra normal Vaginal vault normal with some pink tinged blood (she just finished her menses) Cervix normal without discharge or lesions, points down Bimanual exam notable for enlarged uterus up to level of naval with mass about the size of an orange.  RECTAL: no rectal masses or hemorrhoids Musculoskeletal: Normal range of motion bilateral UE and LE, no joint effusions. Peripheral vascular: Bilateral LE no edema. Neurological: CN II-XII grossly intact with no focal deficits. Alert and oriented to person, place, and time. Coordination, balance, strength, speech and gait are normal.  Skin: Skin is warm and dry. No rash noted. No erythema.  Psychiatric: Patient has a normal mood and affect. Behavior is normal in office today. Judgment and thought content normal in office today.   Assessment & Plan  1. Annual physical exam Reviewed lab work again, pre-diabetes, she is working on diet and exercise and weight loss.  2. Heart palpitations Stable. Normal EKG.  - EKG 12-Lead  3. Screening for STD (sexually transmitted disease)  -  Chlamydia/Gonococcus/Trichomonas, NAA - POCT Urinalysis Dipstick  4. Need for immunization against influenza  - Flu Vaccine QUAD 36+ mos PF IM (Fluarix & Fluzone Quad PF)  5. Encounter for screening for malignant neoplasm of cervix  - Pap IG w/ reflex to HPV when ASC-U  6. Encounter for screening for malignant neoplasm of colon  - Ambulatory referral to General Surgery  7. Encounter for screening mammogram for malignant neoplasm of breast  - MM Digital Screening; Future  8. Uterine leiomyoma, unspecified location Notable clinical findings. We called [facility] and they will fax me pelvic ultrasound report from 09/2014.  I encouraged Kree to consider 2nd opinion regarding surgical removal of uterus vs watchful waiting.  9. Screening for tuberculosis  -  Quantiferon tb gold assay

## 2015-07-20 LAB — CHLAMYDIA/GONOCOCCUS/TRICHOMONAS, NAA: Trich vag by NAA: NEGATIVE

## 2015-07-21 ENCOUNTER — Telehealth: Payer: Self-pay | Admitting: Family Medicine

## 2015-07-21 NOTE — Telephone Encounter (Signed)
I reviewed her pelvic ultrasound results done by her gynecology office in Dec 2015 and the report suggests multiple fibroids making her uterus about 15.1 x 9.5 x 15.9 cm large, it was hard for them to measure the actual fibroids as they all collide together, but the largest was estimated to be about 5 cm.  Overall they have recommended a Pelvic ultrasound to get a more detailed visualization of everything.   We discussed Natasha Mitchell not being ready for surgical hysterectomy however we also don't want to wait until she has complicated bleeding. I recommend moving forward with the pelvic MRI and consulting with a new gynecologist in the near future. Let me know what she would like to do.

## 2015-07-22 LAB — QUANTIFERON IN TUBE
QFT TB AG MINUS NIL VALUE: 0 IU/mL
QUANTIFERON MITOGEN VALUE: 4.48 [IU]/mL
QUANTIFERON TB AG VALUE: 0.03 IU/mL
QUANTIFERON TB GOLD: NEGATIVE
Quantiferon Nil Value: 0.03 IU/mL

## 2015-07-22 LAB — QUANTIFERON TB GOLD ASSAY (BLOOD)

## 2015-07-22 NOTE — Telephone Encounter (Signed)
I tried to contact this patient, but when I dialed the number, it stated "the subscriber you have dialed is not in service. If you feel that this is an error, please hang up and try the number again." I tried 2 more times and got the same response.

## 2015-07-22 NOTE — Telephone Encounter (Signed)
I tried to reach this patient again with the number listed in file and got the same response as I did earlier.

## 2015-07-23 LAB — PAP IG W/ RFLX HPV ASCU: PAP SMEAR COMMENT: 0

## 2015-08-09 ENCOUNTER — Other Ambulatory Visit: Payer: Self-pay | Admitting: Family Medicine

## 2015-08-09 ENCOUNTER — Ambulatory Visit
Admission: RE | Admit: 2015-08-09 | Discharge: 2015-08-09 | Disposition: A | Discharge status: Home / Self Care | Payer: 59 | Source: Ambulatory Visit | Attending: Family Medicine | Admitting: Family Medicine

## 2015-08-09 DIAGNOSIS — Z1231 Encounter for screening mammogram for malignant neoplasm of breast: Secondary | ICD-10-CM | POA: Diagnosis present

## 2015-10-24 DIAGNOSIS — I83812 Varicose veins of left lower extremities with pain: Secondary | ICD-10-CM | POA: Diagnosis not present

## 2015-10-24 DIAGNOSIS — M7981 Nontraumatic hematoma of soft tissue: Secondary | ICD-10-CM | POA: Diagnosis not present

## 2015-10-24 DIAGNOSIS — I87322 Chronic venous hypertension (idiopathic) with inflammation of left lower extremity: Secondary | ICD-10-CM | POA: Diagnosis not present

## 2015-10-24 DIAGNOSIS — I8312 Varicose veins of left lower extremity with inflammation: Secondary | ICD-10-CM | POA: Diagnosis not present

## 2015-11-12 ENCOUNTER — Encounter: Payer: Self-pay | Admitting: Family Medicine

## 2015-11-13 ENCOUNTER — Other Ambulatory Visit: Payer: Self-pay | Admitting: Family Medicine

## 2015-11-13 DIAGNOSIS — Z1211 Encounter for screening for malignant neoplasm of colon: Secondary | ICD-10-CM

## 2015-12-02 ENCOUNTER — Other Ambulatory Visit: Payer: Self-pay | Admitting: Family Medicine

## 2015-12-03 ENCOUNTER — Ambulatory Visit (INDEPENDENT_AMBULATORY_CARE_PROVIDER_SITE_OTHER): Payer: 59 | Admitting: Sports Medicine

## 2015-12-03 ENCOUNTER — Encounter: Payer: Self-pay | Admitting: Sports Medicine

## 2015-12-03 ENCOUNTER — Ambulatory Visit (INDEPENDENT_AMBULATORY_CARE_PROVIDER_SITE_OTHER): Payer: 59

## 2015-12-03 ENCOUNTER — Ambulatory Visit: Payer: 59

## 2015-12-03 ENCOUNTER — Ambulatory Visit: Payer: 59 | Admitting: Podiatry

## 2015-12-03 DIAGNOSIS — M7981 Nontraumatic hematoma of soft tissue: Secondary | ICD-10-CM | POA: Diagnosis not present

## 2015-12-03 DIAGNOSIS — R52 Pain, unspecified: Secondary | ICD-10-CM

## 2015-12-03 DIAGNOSIS — M76829 Posterior tibial tendinitis, unspecified leg: Secondary | ICD-10-CM

## 2015-12-03 DIAGNOSIS — M6789 Other specified disorders of synovium and tendon, multiple sites: Secondary | ICD-10-CM | POA: Diagnosis not present

## 2015-12-03 DIAGNOSIS — M2040 Other hammer toe(s) (acquired), unspecified foot: Secondary | ICD-10-CM

## 2015-12-03 DIAGNOSIS — I83812 Varicose veins of left lower extremities with pain: Secondary | ICD-10-CM | POA: Diagnosis not present

## 2015-12-03 DIAGNOSIS — I8312 Varicose veins of left lower extremity with inflammation: Secondary | ICD-10-CM | POA: Diagnosis not present

## 2015-12-03 DIAGNOSIS — I87322 Chronic venous hypertension (idiopathic) with inflammation of left lower extremity: Secondary | ICD-10-CM | POA: Diagnosis not present

## 2015-12-03 NOTE — Patient Instructions (Signed)

## 2015-12-03 NOTE — Progress Notes (Signed)
Patient ID: Natasha Mitchell, female   DOB: [birth year], 50 y.o.   MRN: [id number] Subjective: Natasha Mitchell is a 50 y.o. female patient who presents to office for evaluation of Right>Left foot pain and to pick up orthotics. Patient complains of progressive pain especially over the last year in the Right>left foot at the tops of her toes.  Patient has tried cushion, pads, changing shoes with no relief in symptoms; states that she get corns and burning sensation over the toes from rubbing in her shoes. Patient denies any other pedal complaints.   Patient Active Problem List   Diagnosis Date Noted  . Encounter for screening for malignant neoplasm of colon 07/18/2015  . Fibroid, uterine 07/18/2015  . Hypertension goal BP (blood pressure) < 140/90 06/18/2015  . Obesity, Class I, BMI 30-34.9 06/18/2015  . Menorrhagia with irregular cycle 06/18/2015  . Depression, major, recurrent, in partial remission ([place]) 06/18/2015  . Hot flashes 06/18/2015  . Heart palpitations 06/18/2015    Current Outpatient Prescriptions on File Prior to Visit  Medication Sig Dispense Refill  . citalopram (CELEXA) 20 MG tablet TAKE 1 TABLET BY MOUTH DAILY 30 tablet 3  . hydrochlorothiazide (HYDRODIURIL) 25 MG tablet Take 1 tablet (25 mg total) by mouth daily. 90 tablet 3  . verapamil (COVERA HS) 180 MG (CO) 24 hr tablet Take 1 tablet (180 mg total) by mouth at bedtime. 90 tablet 3   No current facility-administered medications on file prior to visit.    Allergies  Allergen Reactions  . Latex Rash  . Motrin [Ibuprofen] Rash    Objective:  General: Alert and oriented x3 in no acute distress  Dermatology: No open lesions bilateral lower extremities, no webspace macerations, no ecchymosis bilateral, all nails x 10 are well manicured. + Mild corns at level of IPJs bilateral 2-5 toes with no signs of infection.   Vascular: Dorsalis Pedis and Posterior Tibial pedal pulses 2/4, Capillary Fill Time 3 seconds,(+)  pedal hair growth bilateral, no edema bilateral lower extremities, Temperature gradient within normal limits.  Neurology: Gross sensation intact via light touch bilateral. (- )Tinels sign.   Musculoskeletal: Mild tenderness with palpation at hammertoe deformities on Right>Left,No pain to palpation to PT tendons bilateral, no pain with calf compression bilateral. There is decreased ankle rom with knee extending  vs flexed resembling gastroc equnius bilateral, Subtalar joint range of motion is within normal limits, there is no 1st ray hypermobility noted bilateral, decreased 1st MPJ rom Right>Left with functional limitus noted on weightbearing exam and mild bunion, + pes planus foot structure.Strength within normal limits in all groups bilateral.   Orthotics appear to contour arch and patient is supported in gait with no acute problems .   Xrays  Right & Left Foot   Impression:Normal osseous mineralization, no fracture/dislocation, mild bunion and significant lesser hammertoe deformities, mild midtarsal breach suggestive of pes planus.  Assessment and Plan: Problem List Items Addressed This Visit    None    Visit Diagnoses    Pain    -  Primary    Relevant Orders    DG Foot Complete Right    DG Foot Complete Left    DG Foot Complete Left    Hammertoe, unspecified laterality        Posterior tibial tendon dysfunction           -Complete examination performed -Xrays reviewed -Discussed treatement options for painful hammertoes; recommend surgical management; patient would like to discuss this with her job and  decide on a good time frame for surgery; Consult to be completed at next visit -Recommend to continue with good supportive shoes -Dispensed orthotics with break in period explained and educated patient on use. -Patient to return to office 4 weeks or sooner if condition worsens. Will further discuss hammertoe surgery at this time.   Landis Martins, DPM

## 2015-12-08 ENCOUNTER — Encounter: Payer: Self-pay | Admitting: Family Medicine

## 2015-12-10 ENCOUNTER — Other Ambulatory Visit: Payer: Self-pay | Admitting: Family Medicine

## 2015-12-10 DIAGNOSIS — D259 Leiomyoma of uterus, unspecified: Secondary | ICD-10-CM

## 2015-12-12 DIAGNOSIS — H52223 Regular astigmatism, bilateral: Secondary | ICD-10-CM | POA: Diagnosis not present

## 2015-12-17 ENCOUNTER — Ambulatory Visit: Payer: 59 | Admitting: Podiatry

## 2015-12-20 DIAGNOSIS — Z1211 Encounter for screening for malignant neoplasm of colon: Secondary | ICD-10-CM | POA: Diagnosis not present

## 2015-12-20 DIAGNOSIS — K5909 Other constipation: Secondary | ICD-10-CM | POA: Diagnosis not present

## 2015-12-20 DIAGNOSIS — Z8371 Family history of colonic polyps: Secondary | ICD-10-CM | POA: Diagnosis not present

## 2016-01-02 ENCOUNTER — Encounter: Payer: Self-pay | Admitting: Sports Medicine

## 2016-01-02 ENCOUNTER — Encounter: Payer: Self-pay | Admitting: Family Medicine

## 2016-01-03 ENCOUNTER — Encounter: Payer: Self-pay | Admitting: Physician Assistant

## 2016-01-03 ENCOUNTER — Ambulatory Visit: Payer: Self-pay | Admitting: Physician Assistant

## 2016-01-03 VITALS — BP 138/84 | HR 80 | Temp 98.9°F

## 2016-01-03 DIAGNOSIS — J069 Acute upper respiratory infection, unspecified: Secondary | ICD-10-CM

## 2016-01-03 MED ORDER — CEFDINIR 300 MG PO CAPS: 300.0000 mg | ORAL_CAPSULE | Freq: Two times a day (BID) | ORAL | Status: DC

## 2016-01-03 MED ORDER — FLUCONAZOLE 150 MG PO TABS: 150.0000 mg | ORAL_TABLET | Freq: Once | ORAL | Status: DC

## 2016-01-03 NOTE — Progress Notes (Signed)
S: C/o runny nose and congestion with dry cough for 2 weeks, + fever, chills, denies cp/sob, v/d; mucus was green this am but yellow throughout the day, cough is sporadic,   Using otc meds: choricidan  O: PE: vitals wnl, nad,  perrl eomi, normocephalic, tms dull, nasal mucosa red and swollen, throat injected, neck supple no lymph, lungs c t a, cv rrr, neuro intact,   A:  Acute flu like illness, uri   P: omnicef 300mg  bid, diflucan, drink fluids, continue regular meds , use otc meds of choice, return if not improving in 5 days, return earlier if worsening , explained to patient that tamiflu would not help since sx started 2 weeks ago, flu test would not be accurate, doing empirical coverage for secondary infection

## 2016-01-07 ENCOUNTER — Telehealth: Payer: Self-pay | Admitting: Physician Assistant

## 2016-01-08 ENCOUNTER — Encounter (HOSPITAL_COMMUNITY): Payer: Self-pay

## 2016-01-08 ENCOUNTER — Emergency Department (HOSPITAL_COMMUNITY): Payer: 59

## 2016-01-08 ENCOUNTER — Emergency Department (HOSPITAL_COMMUNITY)
Admission: EM | Admit: 2016-01-08 | Discharge: 2016-01-08 | Disposition: A | Discharge status: Home / Self Care | Payer: 59 | Attending: Emergency Medicine | Admitting: Emergency Medicine

## 2016-01-08 DIAGNOSIS — J069 Acute upper respiratory infection, unspecified: Secondary | ICD-10-CM | POA: Insufficient documentation

## 2016-01-08 DIAGNOSIS — H9209 Otalgia, unspecified ear: Secondary | ICD-10-CM | POA: Insufficient documentation

## 2016-01-08 DIAGNOSIS — M542 Cervicalgia: Secondary | ICD-10-CM | POA: Diagnosis not present

## 2016-01-08 DIAGNOSIS — Z87891 Personal history of nicotine dependence: Secondary | ICD-10-CM | POA: Diagnosis not present

## 2016-01-08 DIAGNOSIS — Z79899 Other long term (current) drug therapy: Secondary | ICD-10-CM | POA: Insufficient documentation

## 2016-01-08 DIAGNOSIS — R05 Cough: Secondary | ICD-10-CM | POA: Diagnosis not present

## 2016-01-08 DIAGNOSIS — Z91040 Latex allergy status: Secondary | ICD-10-CM | POA: Insufficient documentation

## 2016-01-08 DIAGNOSIS — Z8679 Personal history of other diseases of the circulatory system: Secondary | ICD-10-CM | POA: Insufficient documentation

## 2016-01-08 DIAGNOSIS — B9789 Other viral agents as the cause of diseases classified elsewhere: Secondary | ICD-10-CM

## 2016-01-08 MED ORDER — HYDROCODONE-ACETAMINOPHEN 7.5-325 MG/15ML PO SOLN: 10.0000 mL | Freq: Four times a day (QID) | ORAL | Status: DC | PRN

## 2016-01-08 NOTE — Discharge Instructions (Signed)
Viral Infections °A viral infection can be caused by different types of viruses. Most viral infections are not serious and resolve on their own. However, some infections may cause severe symptoms and may lead to further complications. °SYMPTOMS °Viruses can frequently cause: °· Minor sore throat. °· Aches and pains. °· Headaches. °· Runny nose. °· Different types of rashes. °· Watery eyes. °· Tiredness. °· Cough. °· Loss of appetite. °· Gastrointestinal infections, resulting in nausea, vomiting, and diarrhea. °These symptoms do not respond to antibiotics because the infection is not caused by bacteria. However, you might catch a bacterial infection following the viral infection. This is sometimes called a "superinfection." Symptoms of such a bacterial infection may include: °· Worsening sore throat with pus and difficulty swallowing. °· Swollen neck glands. °· Chills and a high or persistent fever. °· Severe headache. °· Tenderness over the sinuses. °· Persistent overall ill feeling (malaise), muscle aches, and tiredness (fatigue). °· Persistent cough. °· Yellow, green, or brown mucus production with coughing. °HOME CARE INSTRUCTIONS  °· Only take over-the-counter or prescription medicines for pain, discomfort, diarrhea, or fever as directed by your caregiver. °· Drink enough water and fluids to keep your urine clear or pale yellow. Sports drinks can provide valuable electrolytes, sugars, and hydration. °· Get plenty of rest and maintain proper nutrition. Soups and broths with crackers or rice are fine. °SEEK IMMEDIATE MEDICAL CARE IF:  °· You have severe headaches, shortness of breath, chest pain, neck pain, or an unusual rash. °· You have uncontrolled vomiting, diarrhea, or you are unable to keep down fluids. °· You or your child has an oral temperature above 102° F (38.9° C), not controlled by medicine. °· Your baby is older than 3 months with a rectal temperature of 102° F (38.9° C) or higher. °· Your baby is 3  months old or younger with a rectal temperature of 100.4° F (38° C) or higher. °MAKE SURE YOU:  °· Understand these instructions. °· Will watch your condition. °· Will get help right away if you are not doing well or get worse. °  °This information is not intended to replace advice given to you by your health care provider. Make sure you discuss any questions you have with your health care provider. °  °Document Released: 07/01/2005 Document Revised: 12/14/2011 Document Reviewed: 02/27/2015 °Elsevier Interactive Patient Education ©2016 Elsevier Inc. ° °

## 2016-01-08 NOTE — ED Notes (Addendum)
Patient here with ongoing cough x 1 week, reports that the cough is dry and worse at night. Taking antibiotic and tessalon. Also taking delsyum with minimal relief, works in healthcare

## 2016-01-08 NOTE — ED Provider Notes (Signed)
CSN: [id number]     Arrival date & time 01/08/16  [id number] History  By signing my name below, I, Natasha Mitchell, attest that this documentation has been prepared under the direction and in the presence of non-physician practitioner, Domenic Moras, PA-C. Electronically Signed: Evelene Mitchell, Scribe. 01/08/2016. 10:29 AM.   Chief Complaint  Patient presents with  . Cough   The history is provided by the patient. No language interpreter was used.    HPI Comments:  Natasha Mitchell is a 50 y.o. female who presents to the Emergency Department complaining of persistent "deep" cough x 1 week. She reports associated ear pain and neck pain secondary to cough She has been evaluated for her symptoms by her PCP and was discharged home with course of Cefdinir which she is still taking, and advised to take delsym which she did without relief. She was then prescribed tessalon pearls which has also provided no relief. 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 She denies weakness/fatigue, congestion, fever and SOB.  Pt had flu shot this season. She also denies h/o PE /DVT, long periods of immobilization, recent surgery, and use of BCP/hormone replacements   Past Medical History  Diagnosis Date  . Intermittent palpitations   . Varicose veins of legs    Past Surgical History  Procedure Laterality Date  . Tubal ligation  1998  . Mouth surgery  2001  . Ablation saphenous vein w/ rfa Bilateral     femoral vein ([facility])   Family History  Problem Relation Age of Onset  . Birth defects Mother     ablation in heart  . Hearing loss Father    Social History  Substance Use Topics  . Smoking status: Former Research scientist (life sciences)  . Smokeless tobacco: None     Comment: quit in 2001  . Alcohol Use: 0.0 oz/week    0 Standard drinks or equivalent per week     Comment: occasional beer   OB History    No data available     Review of Systems  Constitutional: Negative for fever and  fatigue.  HENT: Positive for ear pain. Negative for congestion.   Respiratory: Positive for cough. Negative for shortness of breath.   Musculoskeletal: Positive for neck pain.  Neurological: Negative for weakness.    Allergies  Other; Latex; and Motrin  Home Medications   Prior to Admission medications   Medication Sig Start Date End Date Taking? Authorizing Provider  Biotin 1000 MCG tablet Take by mouth.    Historical Provider, MD  cefdinir (OMNICEF) 300 MG capsule Take 1 capsule (300 mg total) by mouth 2 (two) times daily. 01/03/16   Versie Starks, PA-C  Cholecalciferol (VITAMIN D3) 1000 units CAPS Take by mouth.    Historical Provider, MD  citalopram (CELEXA) 20 MG tablet TAKE 1 TABLET BY MOUTH DAILY 12/02/15   Bobetta Lime, MD  fluconazole (DIFLUCAN) 150 MG tablet Take 1 tablet (150 mg total) by mouth once. 01/03/16   Versie Starks, PA-C  hydrochlorothiazide (HYDRODIURIL) 25 MG tablet Take 1 tablet (25 mg total) by mouth daily. 06/18/15   Bobetta Lime, MD  Omega-3 Fatty Acids (FISH OIL PO) Take by mouth.    Historical Provider, MD  verapamil (COVERA HS) 180 MG (CO) 24 hr tablet Take 1 tablet (180 mg total) by mouth at bedtime. 06/18/15   Bobetta Lime, MD   BP 152/79 mmHg  Pulse 80  Temp(Src) 98.2 F (36.8 C) (Oral)  Resp 18  Ht 5\' 3"  (1.6 m)  Wt 200 lb (90.719 kg)  BMI 35.44 kg/m2  SpO2 100% Physical Exam  Constitutional: She is oriented to person, place, and time. She appears well-developed and well-nourished. No distress.  HENT:  Head: Normocephalic and atraumatic.  Right Ear: Tympanic membrane and external ear normal.  Left Ear: Tympanic membrane and external ear normal.  Nose: Nose normal.  Mouth/Throat: Uvula is midline, oropharynx is clear and moist and mucous membranes are normal. No oropharyngeal exudate or posterior oropharyngeal erythema.  Eyes: Conjunctivae are normal.  Cardiovascular: Normal rate, regular rhythm and normal heart sounds.    Pulmonary/Chest: Effort normal and breath sounds normal. No respiratory distress. She has no wheezes. She has no rhonchi. She has no rales.  Abdominal: She exhibits no distension.  Neurological: She is alert and oriented to person, place, and time.  Skin: Skin is warm and dry.  Psychiatric: She has a normal mood and affect.  Nursing note and vitals reviewed.   ED Course  Procedures  DIAGNOSTIC STUDIES:  Oxygen Saturation is 100% on RA, normal by my interpretation.    COORDINATION OF CARE:  10:23 AM Discussed treatment plan with pt at bedside and pt agreed to plan.  Labs Review Labs Reviewed - No data to display  Imaging Review Dg Chest 2 View  01/08/2016  CLINICAL DATA:  Productive cough with green mucous, SOB x1 week. Right side chest pain that began during a coughing fit x1 day. Pt began antibiotics x5 days ago and is not improving. Hx of HTN. Ex-smoker, quit x16 years ago. EXAM: CHEST - 2 VIEW COMPARISON:  None available FINDINGS: Lungs are clear. Heart size and mediastinal contours are within normal limits. No effusion.  No pneumothorax. Visualized skeletal structures are unremarkable. IMPRESSION: No acute cardiopulmonary disease. Electronically Signed   By: Lucrezia Europe M.D.   On: 01/08/2016 10:49   I have personally reviewed and evaluated these images and lab results as part of my medical decision-making.    MDM   Pt symptoms consistent with URI. CXR negative for acute infiltrate. Pt will be discharged with symptomatic treatment.  Discussed return precautions.  Pt is hemodynamically stable & in NAD prior to discharge.  Final diagnoses:  Viral upper respiratory tract infection with cough    BP 152/79 mmHg  Pulse 80  Temp(Src) 98.2 F (36.8 C) (Oral)  Resp 18  Ht 5\' 3"  (1.6 m)  Wt 90.719 kg  BMI 35.44 kg/m2  SpO2 100%  LMP 01/01/2016   I personally performed the services described in this documentation, which was scribed in my presence. The recorded information  has been reviewed and is accurate.      Domenic Moras, PA-C 01/08/16 [place], MD 01/09/16 [id number]

## 2016-01-15 DIAGNOSIS — I8312 Varicose veins of left lower extremity with inflammation: Secondary | ICD-10-CM | POA: Diagnosis not present

## 2016-01-17 ENCOUNTER — Ambulatory Visit: Payer: 59 | Admitting: Family Medicine

## 2016-01-21 ENCOUNTER — Encounter: Payer: Self-pay | Admitting: Family Medicine

## 2016-01-21 ENCOUNTER — Ambulatory Visit (INDEPENDENT_AMBULATORY_CARE_PROVIDER_SITE_OTHER): Payer: 59 | Admitting: Family Medicine

## 2016-01-21 VITALS — BP 132/66 | HR 79 | Temp 98.0°F | Resp 16 | Ht 63.0 in | Wt 199.4 lb

## 2016-01-21 DIAGNOSIS — Z5181 Encounter for therapeutic drug level monitoring: Secondary | ICD-10-CM | POA: Diagnosis not present

## 2016-01-21 DIAGNOSIS — N921 Excessive and frequent menstruation with irregular cycle: Secondary | ICD-10-CM | POA: Diagnosis not present

## 2016-01-21 DIAGNOSIS — R718 Other abnormality of red blood cells: Secondary | ICD-10-CM | POA: Diagnosis not present

## 2016-01-21 DIAGNOSIS — R7301 Impaired fasting glucose: Secondary | ICD-10-CM

## 2016-01-21 DIAGNOSIS — D259 Leiomyoma of uterus, unspecified: Secondary | ICD-10-CM

## 2016-01-21 DIAGNOSIS — F419 Anxiety disorder, unspecified: Secondary | ICD-10-CM

## 2016-01-21 DIAGNOSIS — N951 Menopausal and female climacteric states: Secondary | ICD-10-CM | POA: Diagnosis not present

## 2016-01-21 DIAGNOSIS — Z23 Encounter for immunization: Secondary | ICD-10-CM | POA: Diagnosis not present

## 2016-01-21 DIAGNOSIS — Z800 Family history of malignant neoplasm of digestive organs: Secondary | ICD-10-CM | POA: Diagnosis not present

## 2016-01-21 DIAGNOSIS — R232 Flushing: Secondary | ICD-10-CM

## 2016-01-21 NOTE — Assessment & Plan Note (Signed)
Sees Dr. Marcelline Mates

## 2016-01-21 NOTE — Patient Instructions (Addendum)
Let's get labs today If you have not heard anything from my staff in a week about any orders/referrals/studies from today, please contact us here to follow-up [phone] Please ask your specialist to send me notes Your goal blood pressure is less than 140 mmHg on top. Try to follow the DASH guidelines (DASH stands for Dietary Approaches to Stop Hypertension) Try to limit the sodium in your diet.  Ideally, consume less than 1.5 grams (less than 1,500mg ) per day. Do not add salt when cooking or at the table.  Check the sodium amount on labels when shopping, and choose items lower in sodium when given a choice. Avoid or limit foods that already contain a lot of sodium. Eat a diet rich in fruits and vegetables and whole grains.  Check out the information at familydoctor.org entitled "What It Takes to Lose Weight" Try to lose between 1-2 pounds per week by taking in fewer calories and burning off more calories You can succeed by limiting portions, limiting foods dense in calories and fat, becoming more active, and drinking 8 glasses of water a day (64 ounces) Don't skip meals, especially breakfast, as skipping meals may alter your metabolism Do not use over-the-counter weight loss pills or gimmicks that claim rapid weight loss A healthy BMI (or body mass index) is between 18.5 and 24.9 You can calculate your ideal BMI at the [place] website ClubMonetize.fr   DASH Eating Plan DASH stands for "Dietary Approaches to Stop Hypertension." The DASH eating plan is a healthy eating plan that has been shown to reduce high blood pressure (hypertension). Additional health benefits may include reducing the risk of type 2 diabetes mellitus, heart disease, and stroke. The DASH eating plan may also help with weight loss. WHAT DO I NEED TO KNOW ABOUT THE DASH EATING PLAN? For the DASH eating plan, you will follow these general guidelines:  Choose foods with a  percent daily value for sodium of less than 5% (as listed on the food label).  Use salt-free seasonings or herbs instead of table salt or sea salt.  Check with your health care provider or pharmacist before using salt substitutes.  Eat lower-sodium products, often labeled as "lower sodium" or "no salt added."  Eat fresh foods.  Eat more vegetables, fruits, and low-fat dairy products.  Choose whole grains. Look for the word "whole" as the first word in the ingredient list.  Choose fish and skinless chicken or [place] more often than red meat. Limit fish, poultry, and meat to 6 oz (170 g) each day.  Limit sweets, desserts, sugars, and sugary drinks.  Choose heart-healthy fats.  Limit cheese to 1 oz (28 g) per day.  Eat more home-cooked food and less restaurant, buffet, and fast food.  Limit fried foods.  Cook foods using methods other than frying.  Limit canned vegetables. If you do use them, rinse them well to decrease the sodium.  When eating at a restaurant, ask that your food be prepared with less salt, or no salt if possible. WHAT FOODS CAN I EAT? Seek help from a dietitian for individual calorie needs. Grains Whole grain or whole wheat bread. Brown rice. Whole grain or whole wheat pasta. Quinoa, bulgur, and whole grain cereals. Low-sodium cereals. Corn or whole wheat flour tortillas. Whole grain cornbread. Whole grain crackers. Low-sodium crackers. Vegetables Fresh or frozen vegetables (raw, steamed, roasted, or grilled). Low-sodium or reduced-sodium tomato and vegetable juices. Low-sodium or reduced-sodium tomato sauce and paste. Low-sodium or reduced-sodium canned vegetables.  Fruits  All fresh, canned (in natural juice), or frozen fruits. Meat and Other Protein Products Ground beef (85% or leaner), grass-fed beef, or beef trimmed of fat. Skinless chicken or [place]. Ground chicken or [place]. Pork trimmed of fat. All fish and seafood. Eggs. Dried beans, peas, or lentils.  Unsalted nuts and seeds. Unsalted canned beans. Dairy Low-fat dairy products, such as skim or 1% milk, 2% or reduced-fat cheeses, low-fat ricotta or cottage cheese, or plain low-fat yogurt. Low-sodium or reduced-sodium cheeses. Fats and Oils Tub margarines without trans fats. Light or reduced-fat mayonnaise and salad dressings (reduced sodium). Avocado. Safflower, olive, or canola oils. Natural peanut or almond butter. Other Unsalted popcorn and pretzels. The items listed above may not be a complete list of recommended foods or beverages. Contact your dietitian for more options. WHAT FOODS ARE NOT RECOMMENDED? Grains White bread. White pasta. White rice. Refined cornbread. Bagels and croissants. Crackers that contain trans fat. Vegetables Creamed or fried vegetables. Vegetables in a cheese sauce. Regular canned vegetables. Regular canned tomato sauce and paste. Regular tomato and vegetable juices. Fruits Dried fruits. Canned fruit in light or heavy syrup. Fruit juice. Meat and Other Protein Products Fatty cuts of meat. Ribs, chicken wings, bacon, sausage, bologna, salami, chitterlings, fatback, hot dogs, bratwurst, and packaged luncheon meats. Salted nuts and seeds. Canned beans with salt. Dairy Whole or 2% milk, cream, half-and-half, and cream cheese. Whole-fat or sweetened yogurt. Full-fat cheeses or blue cheese. Nondairy creamers and whipped toppings. Processed cheese, cheese spreads, or cheese curds. Condiments Onion and garlic salt, seasoned salt, table salt, and sea salt. Canned and packaged gravies. Worcestershire sauce. Tartar sauce. Barbecue sauce. Teriyaki sauce. Soy sauce, including reduced sodium. Steak sauce. Fish sauce. Oyster sauce. Cocktail sauce. Horseradish. Ketchup and mustard. Meat flavorings and tenderizers. Bouillon cubes. Hot sauce. Tabasco sauce. Marinades. Taco seasonings. Relishes. Fats and Oils Butter, stick margarine, lard, shortening, ghee, and bacon fat. Coconut,  palm kernel, or palm oils. Regular salad dressings. Other Pickles and olives. Salted popcorn and pretzels. The items listed above may not be a complete list of foods and beverages to avoid. Contact your dietitian for more information. WHERE CAN I FIND MORE INFORMATION? National Heart, Lung, and Blood Institute: travelstabloid.com   This information is not intended to replace advice given to you by your health care provider. Make sure you discuss any questions you have with your health care provider.   Document Released: 09/10/2011 Document Revised: 10/12/2014 Document Reviewed: 07/26/2013 Elsevier Interactive Patient Education [employer].

## 2016-01-21 NOTE — Assessment & Plan Note (Signed)
Continue celexa, helping symptoms

## 2016-01-21 NOTE — Progress Notes (Signed)
BP 132/66 mmHg  Pulse 79  Temp(Src) 98 F (36.7 C) (Oral)  Resp 16  Ht 5\' 3"  (1.6 m)  Wt 199 lb 6.4 oz (90.447 kg)  BMI 35.33 kg/m2  SpO2 97%  LMP 12/17/2015 (Exact Date)   Subjective:    Patient ID: Natasha Mitchell, female    DOB: Feb 20, 1966, 50 y.o.   MRN: [id number]  HPI: Natasha Mitchell is a 50 y.o. female  Chief Complaint  Patient presents with  . Medication Refill    6 month f/u  . Hypertension  . Depression  . Menstrual Problem    heavy menstrual. Has an appt. w/ [facility] May 5 Dr. Marcelline Mates   Patient is new to me, as her previous provider left this practice; she is here for six month f/u Palpitations are gone celexa has really helped her; was having perimenopause type issues; the medicine helped knocked the edge off, more anxious than anything; she did not have depression; flux with a little anxiety; has two more months and is welcome to call for Rx before next appt Has had heavy bleeding, on and off; bled for most of February; just started again April 15th; every 2.5 weeks; she is fasting today and concerned about her iron; has hx of iron deficiency Sees Dr. Marcelline Mates May 4th (GYN) Elevated A1c last time; grandparent had DM, parents are prediabetic She has been going to the gym, lost total of 50 pounds  Colonoscopy scheduled for 02/13/2023; two first cousins died from colon cancer; she may have weakness in rectal sphincter, will be addressed by GI; had some stool incontinence; bad about 6 months  Depression screen [facility] 2/9 01/21/2016 06/18/2015  Decreased Interest 0 0  Down, Depressed, Hopeless 0 1  PHQ - 2 Score 0 1   Relevant past medical, surgical, family and social history reviewed Past Medical History  Diagnosis Date  . Intermittent palpitations   . Varicose veins of legs   . Hypertension   . Family hx of colon cancer 02/15/2016   Past Surgical History  Procedure Laterality Date  . Tubal ligation  1998  . Mouth surgery  2001  . Ablation saphenous  vein w/ rfa Bilateral     femoral vein ([facility])   Family History  Problem Relation Age of Onset  . Birth defects Mother     ablation in heart  . Hearing loss Father   colon cancer -- first cousins  Social History  Substance Use Topics  . Smoking status: Former Research scientist (life sciences)  . Smokeless tobacco: Never Used     Comment: quit in 2001  . Alcohol Use: No     Comment: occasional beer   Interim medical history since last visit reviewed. Allergies and medications reviewed and updated.  Review of Systems Per HPI unless specifically indicated above     Objective:    BP 132/66 mmHg  Pulse 79  Temp(Src) 98 F (36.7 C) (Oral)  Resp 16  Ht 5\' 3"  (1.6 m)  Wt 199 lb 6.4 oz (90.447 kg)  BMI 35.33 kg/m2  SpO2 97%  LMP 12/17/2015 (Exact Date)  Wt Readings from Last 3 Encounters:  02/11/16 198 lb (89.812 kg)  02/06/16 203 lb (92.08 kg)  01/21/16 199 lb 6.4 oz (90.447 kg)    Physical Exam  Constitutional: She appears well-developed and well-nourished. No distress.  Obese  HENT:  Head: Normocephalic and atraumatic.  Eyes: EOM are normal. No scleral icterus.  Neck: No thyromegaly present.  Cardiovascular:  Normal rate, regular rhythm and normal heart sounds.   No murmur heard. Pulmonary/Chest: Effort normal and breath sounds normal. No respiratory distress. She has no wheezes.  Abdominal: Soft. Bowel sounds are normal. She exhibits no distension.  Musculoskeletal: Normal range of motion. She exhibits no edema.  Neurological: She is alert. She exhibits normal muscle tone.  Skin: Skin is warm and dry. She is not diaphoretic. No pallor.  Psychiatric: She has a normal mood and affect. Her behavior is normal. Judgment and thought content normal.      Assessment & Plan:   Problem List Items Addressed This Visit      Endocrine   IFG (impaired fasting glucose)    Check A1c; positive fam hx of diabetes; weight loss would help slow/prevent progression to frank  diabetes      Relevant Orders   Hgb A1c w/o eAG (Completed)     Genitourinary   Fibroid, uterine    Sees Dr. Marcelline Mates        Other   Anxiety disorder    Anxiety related to perimenopause, not depression or typical GAD; continue SSRI      Family hx of colon cancer    So glad patient has colonoscopy coming up      Hot flashes - Primary    Continue celexa, helping symptoms      Menorrhagia with irregular cycle    Sees Dr. Marcelline Mates      Relevant Orders   CBC with Differential/Platelet (Completed)   IBC panel   Microcytosis    Patient reports anemia, low iron for quite some time; will check labs today, CBC, IBC panel, and hemoglobinopathy panel in case microcytosis independent of iron      Relevant Orders   CBC with Differential/Platelet (Completed)   IBC panel   Hemoglobinopathy evaluation (Completed)    Other Visit Diagnoses    Medication monitoring encounter        Relevant Orders    Comprehensive metabolic panel (Completed)    Need for Tdap vaccination        given today by CMA    Relevant Orders    Tdap vaccine greater than or equal to 7yo IM (Completed)       Follow up plan: Return in about 6 months (around 07/20/2016) for complete physical, and fasting labs.  An after-visit summary was printed and given to the patient at [place].  Please see the patient instructions which may contain other information and recommendations beyond what is mentioned above in the assessment and plan.  Orders Placed This Encounter  Procedures  . Tdap vaccine greater than or equal to 7yo IM  . Hgb A1c w/o eAG  . Comprehensive metabolic panel  . CBC with Differential/Platelet  . IBC panel  . Hemoglobinopathy evaluation  . Iron and TIBC

## 2016-01-22 ENCOUNTER — Telehealth: Payer: Self-pay | Admitting: Family Medicine

## 2016-01-22 DIAGNOSIS — D509 Iron deficiency anemia, unspecified: Secondary | ICD-10-CM

## 2016-01-22 NOTE — Telephone Encounter (Signed)
She has significant microcytic hypochromic anemia with elev platelet count, low iron, low ferritin I see that she was referred to Dr. Allen Norris for colonoscopy but don't see that she's had it done I also see that she was referred to GYN for uterine fibroids Impaired fasting glucose a little improved I left message just saying that I was calling about labs; please call me back when convenient to go over her results

## 2016-01-23 LAB — COMPREHENSIVE METABOLIC PANEL
ALT: 14 IU/L (ref 0–32)
AST: 24 IU/L (ref 0–40)
Albumin/Globulin Ratio: 1.3 (ref 1.2–2.2)
Albumin: 4 g/dL (ref 3.5–5.5)
Alkaline Phosphatase: 82 IU/L (ref 39–117)
BUN/Creatinine Ratio: 20 (ref 9–23)
BUN: 15 mg/dL (ref 6–24)
Bilirubin Total: 0.3 mg/dL (ref 0.0–1.2)
CALCIUM: 9.1 mg/dL (ref 8.7–10.2)
CHLORIDE: 99 mmol/L (ref 96–106)
CO2: 26 mmol/L (ref 18–29)
Creatinine, Ser: 0.75 mg/dL (ref 0.57–1.00)
GFR, EST AFRICAN AMERICAN: 108 mL/min/{1.73_m2} (ref >59)
GFR, EST NON AFRICAN AMERICAN: 94 mL/min/{1.73_m2} (ref >59)
GLUCOSE: 100 mg/dL — AB (ref 65–99)
Globulin, Total: 3 g/dL (ref 1.5–4.5)
Potassium: 4.2 mmol/L (ref 3.5–5.2)
Sodium: 139 mmol/L (ref 134–144)
Total Protein: 7 g/dL (ref 6.0–8.5)

## 2016-01-23 LAB — HEMOGLOBINOPATHY EVALUATION
HEMOGLOBIN A2 QUANTITATION: 1.9 % (ref 0.7–3.1)
HEMOGLOBIN F QUANTITATION: 0 % (ref 0.0–2.0)
HGB C: 0 %
HGB S: 0 %
Hgb A: 98.1 % — ABNORMAL HIGH (ref 94.0–98.0)

## 2016-01-23 LAB — CBC WITH DIFFERENTIAL/PLATELET
BASOS ABS: 0 10*3/uL (ref 0.0–0.2)
BASOS: 1 %
EOS (ABSOLUTE): 0.4 10*3/uL (ref 0.0–0.4)
Eos: 8 %
HEMOGLOBIN: 10.8 g/dL — AB (ref 11.1–15.9)
Hematocrit: 34.9 % (ref 34.0–46.6)
IMMATURE GRANS (ABS): 0 10*3/uL (ref 0.0–0.1)
IMMATURE GRANULOCYTES: 0 %
LYMPHS: 42 %
Lymphocytes Absolute: 2.3 10*3/uL (ref 0.7–3.1)
MCH: 21.1 pg — AB (ref 26.6–33.0)
MCHC: 30.9 g/dL — ABNORMAL LOW (ref 31.5–35.7)
MCV: 68 fL — ABNORMAL LOW (ref 79–97)
MONOCYTES: 9 %
Monocytes Absolute: 0.5 10*3/uL (ref 0.1–0.9)
NEUTROS ABS: 2.2 10*3/uL (ref 1.4–7.0)
Neutrophils: 40 %
Platelets: 531 10*3/uL — ABNORMAL HIGH (ref 150–379)
RBC: 5.13 x10E6/uL (ref 3.77–5.28)
RDW: 20.2 % — ABNORMAL HIGH (ref 12.3–15.4)
WBC: 5.4 10*3/uL (ref 3.4–10.8)

## 2016-01-23 LAB — HGB A1C W/O EAG: HEMOGLOBIN A1C: 5.9 % — AB (ref 4.8–5.6)

## 2016-01-23 LAB — IRON AND TIBC
Iron Saturation: 6 % — CL (ref 15–55)
Iron: 23 ug/dL — ABNORMAL LOW (ref 27–159)
TIBC: 369 ug/dL (ref 250–450)
UIBC: 346 ug/dL (ref 131–425)

## 2016-01-26 DIAGNOSIS — D509 Iron deficiency anemia, unspecified: Secondary | ICD-10-CM | POA: Insufficient documentation

## 2016-01-26 NOTE — Telephone Encounter (Signed)
I called, reached identified voicemail; calling about labs; do not be alarmed that I'm calling a Sunday; just catching up ------------------------ [facility] staff -- Please let patient know that her A1c has improved; it is in the "prediabetes" category, but better than last time; try to limit white bread, white potatoes, white rice, sweets, sugary drinks; check again in 6 months She has significant iron deficiency anemia; I do not see that she has had her colonoscopy; Dr. Gustavo Lah note in [place] to have that rescheduled; please encourage her to get that done ASAP We'll want her to start OTC iron pills, 324 or 325 mg once a day, AFTER she has her colonoscopy Recheck CBC in 4 weeks

## 2016-01-27 NOTE — Telephone Encounter (Signed)
Left voice mail

## 2016-01-28 ENCOUNTER — Encounter: Payer: Self-pay | Admitting: Family Medicine

## 2016-01-28 DIAGNOSIS — R7301 Impaired fasting glucose: Secondary | ICD-10-CM

## 2016-01-28 MED ORDER — POLYSACCHARIDE IRON COMPLEX 150 MG PO CAPS: 150.0000 mg | ORAL_CAPSULE | Freq: Every day | ORAL | Status: DC

## 2016-01-28 NOTE — Assessment & Plan Note (Signed)
Refer to [facility]

## 2016-02-04 ENCOUNTER — Ambulatory Visit: Admission: RE | Admit: 2016-02-04 | Payer: 59 | Source: Ambulatory Visit | Admitting: Gastroenterology

## 2016-02-04 ENCOUNTER — Encounter: Admission: RE | Payer: Self-pay | Source: Ambulatory Visit

## 2016-02-04 SURGERY — COLONOSCOPY WITH PROPOFOL
Anesthesia: General

## 2016-02-05 DIAGNOSIS — D126 Benign neoplasm of colon, unspecified: Secondary | ICD-10-CM | POA: Diagnosis not present

## 2016-02-05 DIAGNOSIS — Z8371 Family history of colonic polyps: Secondary | ICD-10-CM | POA: Diagnosis not present

## 2016-02-05 DIAGNOSIS — K5730 Diverticulosis of large intestine without perforation or abscess without bleeding: Secondary | ICD-10-CM | POA: Diagnosis not present

## 2016-02-05 DIAGNOSIS — D125 Benign neoplasm of sigmoid colon: Secondary | ICD-10-CM | POA: Diagnosis not present

## 2016-02-05 DIAGNOSIS — Z1211 Encounter for screening for malignant neoplasm of colon: Secondary | ICD-10-CM | POA: Diagnosis not present

## 2016-02-05 DIAGNOSIS — K635 Polyp of colon: Secondary | ICD-10-CM | POA: Diagnosis not present

## 2016-02-05 DIAGNOSIS — Z800 Family history of malignant neoplasm of digestive organs: Secondary | ICD-10-CM | POA: Diagnosis not present

## 2016-02-06 ENCOUNTER — Encounter: Payer: Self-pay | Admitting: Obstetrics and Gynecology

## 2016-02-06 ENCOUNTER — Ambulatory Visit (INDEPENDENT_AMBULATORY_CARE_PROVIDER_SITE_OTHER): Payer: 59 | Admitting: Obstetrics and Gynecology

## 2016-02-06 ENCOUNTER — Encounter: Payer: Self-pay | Admitting: Family Medicine

## 2016-02-06 VITALS — BP 133/69 | HR 83 | Ht 63.0 in | Wt 203.0 lb

## 2016-02-06 DIAGNOSIS — K635 Polyp of colon: Secondary | ICD-10-CM | POA: Diagnosis not present

## 2016-02-06 DIAGNOSIS — N921 Excessive and frequent menstruation with irregular cycle: Secondary | ICD-10-CM

## 2016-02-06 DIAGNOSIS — N938 Other specified abnormal uterine and vaginal bleeding: Secondary | ICD-10-CM | POA: Diagnosis not present

## 2016-02-06 DIAGNOSIS — D259 Leiomyoma of uterus, unspecified: Secondary | ICD-10-CM | POA: Diagnosis not present

## 2016-02-06 DIAGNOSIS — N840 Polyp of corpus uteri: Secondary | ICD-10-CM | POA: Diagnosis not present

## 2016-02-06 DIAGNOSIS — N924 Excessive bleeding in the premenopausal period: Secondary | ICD-10-CM

## 2016-02-06 DIAGNOSIS — D500 Iron deficiency anemia secondary to blood loss (chronic): Secondary | ICD-10-CM | POA: Diagnosis not present

## 2016-02-06 DIAGNOSIS — Z124 Encounter for screening for malignant neoplasm of cervix: Secondary | ICD-10-CM

## 2016-02-10 ENCOUNTER — Encounter: Payer: Self-pay | Admitting: Obstetrics and Gynecology

## 2016-02-10 ENCOUNTER — Encounter: Payer: Self-pay | Admitting: *Deleted

## 2016-02-10 ENCOUNTER — Encounter: Payer: Self-pay | Admitting: Family Medicine

## 2016-02-10 NOTE — Progress Notes (Signed)
GYNECOLOGY CLINIC PROGRESS NOTE  Subjective:     Natasha Mitchell is an 50 y.o. [phone] woman who presents for irregular menses.  Patient has a known h/o fibroids, diagnosed in 2015. Notes that she has a regular cycle monthly, however cycles are very heavy, requiring use of tampon and pad on heaviest days, changing 5-6 times daily. Clots are 3-4 cm in size. Dysmenorrhea:mild, occurring first 1-2 days of flow.  Cycles have been becoming progressively worse over the past 2 years.  Also reports that she has had 3 months of intermenstrual bleeding, lasting approximately 2-3 days, usually lighter than a period. Does not pass blood clots during this time.  Current contraception: tubal ligation. History of infertility: no. History of abnormal Pap smear: no.  Patient has a h/o anemia currenlty taking iron pills.   Of note, patient also has been having bloody stools, had a colonoscopy yesterday, where several small polyps were noted.  They were unable to completely remove the polyps, so they were biopsied instead.  Is awaiting results of these.   Menstrual History: Obstetric History   G3   P3   T3   P0   A0   TAB0   SAB0   E0   M0   L3     # Outcome Date GA Lbr Len/2nd Weight Sex Delivery Anes PTL Lv  3 Term      Vag-Spont   Y  2 Term      Vag-Spont   Y  1 Term      Vag-Spont   Y     Menarche age: 34 Patient's last menstrual period was 01/19/2016. Denies h/o STIs.  Last pap smear: ~ 2-3 years ago.  Last mammogram: 08/2015.  Results were normal.      Past Medical History  Diagnosis Date  . Intermittent palpitations   . Varicose veins of legs     Family History  Problem Relation Age of Onset  . Birth defects Mother     ablation in heart  . Hearing loss Father     Past Surgical History  Procedure Laterality Date  . Tubal ligation  1998  . Mouth surgery  2001  . Ablation saphenous vein w/ rfa Bilateral     femoral vein ([facility])    Social History    Social History  . Marital Status: Single    Spouse Name: N/A  . Number of Children: N/A  . Years of Education: N/A   Occupational History  . Not on file.   Social History Main Topics  . Smoking status: Former Research scientist (life sciences)  . Smokeless tobacco: Not on file     Comment: quit in 2001  . Alcohol Use: 0.0 oz/week    0 Standard drinks or equivalent per week     Comment: occasional beer  . Drug Use: No  . Sexual Activity: No   Other Topics Concern  . Not on file   Social History Narrative    Current Outpatient Prescriptions on File Prior to Visit  Medication Sig Dispense Refill  . Biotin 1000 MCG tablet Take by mouth.    . Cholecalciferol (VITAMIN D3) 1000 units CAPS Take by mouth.    . citalopram (CELEXA) 20 MG tablet TAKE 1 TABLET BY MOUTH DAILY 30 tablet 3  . hydrochlorothiazide (HYDRODIURIL) 25 MG tablet Take 1 tablet (25 mg total) by mouth daily. 90 tablet 3  . iron polysaccharides (NIFEREX) 150 MG capsule Take 1 capsule (150 mg total)  by mouth daily. 30 capsule 2  . Omega-3 Fatty Acids (FISH OIL PO) Take by mouth.    . verapamil (COVERA HS) 180 MG (CO) 24 hr tablet Take 1 tablet (180 mg total) by mouth at bedtime. 90 tablet 3   No current facility-administered medications on file prior to visit.    Allergies  Allergen Reactions  . Delsym Cough Relief [Dextromethorphan-Menthol] Shortness Of Breath  . Other Technical brewer  . Latex Rash  . Motrin [Ibuprofen] Rash    Review of Systems Pertinent items noted in HPI and remainder of comprehensive ROS otherwise negative.    Objective:    BP 133/69 mmHg  Pulse 83  Ht 5\' 3"  (1.6 m)  Wt 203 lb (92.08 kg)  BMI 35.97 kg/m2  LMP 01/19/2016  General:   alert and no distress  Skin:    normal and no rash or abnormalities  Neck:  no adenopathy, no carotid bruit, no JVD, supple, symmetrical, trachea midline and thyroid not enlarged, symmetric, no tenderness/mass/nodules  Abdomen:  normal findings: no  organomegaly, no scars, striae, dilated veins, rashes, or lesions and soft, non-tender and abnormal findings:  mass, located in the lower abdomen  Pelvic:   external genitalia normal, rectovaginal septum normal and vagina normal without discharge. Cervix appears normal, deviated up and to the right.  Uterus enlargement, 16 week size, mobile, nontender.  Adnexae without tenderness, non-palpable.   Extremities:  extremities normal, atraumatic, no cyanosis or edema  Neurologic:  Grossly normal       Labs:  Results for SUZET, RIEDLINGER (MRN [id number]) as of 02/06/2016 12:26  Ref. Range 01/21/2016 10:03  Iron Latest Ref Range: 27-159 ug/dL 23 (L)  UIBC Latest Ref Range: 131-425 ug/dL 346  TIBC Latest Ref Range: 250-450 ug/dL 369  Iron Saturation Latest Ref Range: 15-55 % 6 (LL)  Globulin, Total Latest Ref Range: 1.5-4.5 g/dL 3.0  WBC Latest Ref Range: 3.4-10.8 x10E3/uL 5.4  RBC Latest Ref Range: 3.77-5.28 x10E6/uL 5.13  Hemoglobin Latest Ref Range: 11.1-15.9 g/dL 10.8 (L)  HCT Latest Ref Range: 34.0-46.6 % 34.9  MCV Latest Ref Range: 79-97 fL 68 (L)  MCH Latest Ref Range: 26.6-33.0 pg 21.1 (L)  MCHC Latest Ref Range: 31.5-35.7 g/dL 30.9 (L)  RDW Latest Ref Range: 12.3-15.4 % 20.2 (H)  Platelets Latest Ref Range: 150-379 x10E3/uL 531 (H)   Lab Results  Component Value Date   TSH 0.705 06/18/2015   Results for Levi, Natasha Mitchell (MRN [id number]) as of 02/06/2016 12:26  Ref. Range 06/18/2015 10:40  LH Latest Units: mIU/mL 1.5  FSH Latest Units: mIU/mL 2.1     Imaging Pelvic Ultrasound 09/07/2014:  FINDINGS:  Uterus   Measurements: Approximately 15.1 x 9.5 by 15.9 cm, although difficult to accurately measured due to its large size. Multiple uterine fibroids are seen which diffusely involve the uterine corpus and fundus. These are also difficult to individually measure, however the largest individual fibroids appear to measure approximately 5 cm.   Endometrium   Thickness: Not  well visualized due to acoustic shadowing from multiple fibroids.   Right ovary   Measurements: 3.1 x 2.3 x 3.2 cm. Normal appearance/no adnexal mass.   Left ovary   Measurements: Not directly visualized by transabdominal or transvaginal sonography, however no adnexal mass identified.   Other findings   No free fluid.   IMPRESSION:  Multiple uterine fibroids diffusely involving the uterine corpus and fundus. Consider further evaluation with pelvic MRI to better assess overall  uterine volume, fibroid number and size, if clinically warranted.   Normal appearance of right ovary.   Nonvisualization of left ovary, however no adnexal mass identified.   Assessment:   Menometrorrhagia,  Perimenopausal bleeding  Uterine fibroids   H/o anemia Colon polyps   Plan:    All questions answered. Diagnosis explained in detail, including differential. Endometrial biopsy - see separate procedure note. Pap smear. Pelvic ultrasound.   Continue iron supplements.  Discussed management options for abnormal uterine bleeding including tranexamic acid (Lysteda), oral progesterone (Megace), Depo Provera, Mirena IUD, endometrial ablation (Novasure/Hydrothermal Ablation) or hysterectomy as definitive surgical management.  Discussed risks and benefits of each method.   Patient desires hysterectomy.  Printed patient education handouts were given to the patient to review at home. Provera prescribed as needed for now,  bleeding precautions reviewed. Will await biopsy results.  Awaiting biopsy results from colon polyps as this may determine if further procedures/surgery is warranted.  May require joint surgical case.    Endometrial Biopsy Procedure Note  The patient is positioned on the exam table in the dorsal lithotomy position. Bimanual exam confirms uterine position and size. A Graves speculum is placed into the vagina. A single toothed tenaculum is placed onto the anterior lip of the  cervix. The pipette is placed into the endocervical canal and is advanced to the uterine fundus. Using a piston like technique, with vacuum created by withdrawing the stylus, the endometrial specimen is obtained and transferred to the biopsy container. Minimal bleeding is encountered. The procedure is well tolerated.   Uterine Position: anterior    Uterine Length: 10 cm   Uterine Specimen: Average   Post procedure instructions are given. The patient is scheduled for follow up appointment.    A total of 45 minutes were spent face-to-face with the patient during the encounter with greater than 50% dealing with counseling and coordination of care.   Rubie Maid, MD [facility]

## 2016-02-11 ENCOUNTER — Encounter
Admission: RE | Disposition: A | Discharge status: Home / Self Care | Payer: Self-pay | Source: Ambulatory Visit | Attending: Gastroenterology

## 2016-02-11 ENCOUNTER — Encounter: Payer: Self-pay | Admitting: Anesthesiology

## 2016-02-11 ENCOUNTER — Ambulatory Visit: Payer: 59 | Admitting: Anesthesiology

## 2016-02-11 ENCOUNTER — Ambulatory Visit
Admission: RE | Admit: 2016-02-11 | Discharge: 2016-02-11 | Disposition: A | Discharge status: Home / Self Care | Payer: 59 | Source: Ambulatory Visit | Attending: Gastroenterology | Admitting: Gastroenterology

## 2016-02-11 DIAGNOSIS — K635 Polyp of colon: Secondary | ICD-10-CM | POA: Diagnosis not present

## 2016-02-11 DIAGNOSIS — Z91040 Latex allergy status: Secondary | ICD-10-CM | POA: Insufficient documentation

## 2016-02-11 DIAGNOSIS — Z79899 Other long term (current) drug therapy: Secondary | ICD-10-CM | POA: Insufficient documentation

## 2016-02-11 DIAGNOSIS — Z888 Allergy status to other drugs, medicaments and biological substances status: Secondary | ICD-10-CM | POA: Diagnosis not present

## 2016-02-11 DIAGNOSIS — D125 Benign neoplasm of sigmoid colon: Secondary | ICD-10-CM | POA: Insufficient documentation

## 2016-02-11 DIAGNOSIS — R002 Palpitations: Secondary | ICD-10-CM | POA: Insufficient documentation

## 2016-02-11 DIAGNOSIS — I8390 Asymptomatic varicose veins of unspecified lower extremity: Secondary | ICD-10-CM | POA: Diagnosis not present

## 2016-02-11 DIAGNOSIS — D490 Neoplasm of unspecified behavior of digestive system: Secondary | ICD-10-CM | POA: Diagnosis not present

## 2016-02-11 DIAGNOSIS — I10 Essential (primary) hypertension: Secondary | ICD-10-CM | POA: Diagnosis not present

## 2016-02-11 HISTORY — DX: Essential (primary) hypertension: I10

## 2016-02-11 HISTORY — PX: COLONOSCOPY WITH PROPOFOL: SHX5780

## 2016-02-11 SURGERY — COLONOSCOPY WITH PROPOFOL
Anesthesia: General

## 2016-02-11 MED ORDER — LIDOCAINE HCL (CARDIAC) 20 MG/ML IV SOLN
INTRAVENOUS | Status: DC | PRN
  Administered 2016-02-11 (×2): 30 mg via INTRAVENOUS

## 2016-02-11 MED ORDER — SODIUM CHLORIDE 0.9 % IV SOLN
INTRAVENOUS | Status: DC
  Administered 2016-02-11: 1000 mL via INTRAVENOUS

## 2016-02-11 MED ORDER — SODIUM CHLORIDE 0.9 % IV SOLN: INTRAVENOUS | Status: DC

## 2016-02-11 MED ORDER — FENTANYL CITRATE (PF) 100 MCG/2ML IJ SOLN
INTRAMUSCULAR | Status: DC | PRN
  Administered 2016-02-11: 50 ug via INTRAVENOUS

## 2016-02-11 MED ORDER — PROPOFOL 500 MG/50ML IV EMUL
INTRAVENOUS | Status: DC | PRN
  Administered 2016-02-11: 180 ug/kg/min via INTRAVENOUS

## 2016-02-11 MED ORDER — MIDAZOLAM HCL 5 MG/5ML IJ SOLN
INTRAMUSCULAR | Status: DC | PRN
  Administered 2016-02-11: 1 mg via INTRAVENOUS

## 2016-02-11 MED ORDER — PROPOFOL 10 MG/ML IV BOLUS
INTRAVENOUS | Status: DC | PRN
  Administered 2016-02-11: 100 mg via INTRAVENOUS

## 2016-02-11 NOTE — Op Note (Addendum)
[facility] Gastroenterology Patient Name: Natasha Mitchell Procedure Date: 02/11/2016 11:17 AM MRN: [id number] Account #: [id number] Date of Birth: [birth year] Admit Type: Outpatient Age: 50 Room: [facility] ENDO ROOM 3 Gender: Female Note Status: Supervisor Override Procedure:            Flexible Sigmoidoscopy Indications:          Personal history of colonic polyps Providers:            Lollie Sails, MD Referring MD:         Arnetha Courser (Referring MD) Medicines:            Monitored Anesthesia Care Complications:        No immediate complications. Procedure:            Pre-Anesthesia Assessment:                       - ASA Grade Assessment: II - A patient with mild                        systemic disease.                       After obtaining informed consent, the scope was passed                        under direct vision. The Colonoscope was introduced                        through the anus and advanced to the the sigmoid colon.                        After obtaining informed consent, the scope was passed                        under direct vision.The flexible sigmoidoscopy was                        accomplished without difficulty. The patient tolerated                        the procedure well. The quality of the bowel                        preparation was good. Findings:      A 26 mm polyp was found in the mid sigmoid colon. The polyp was much       better exposed with better access. The polyp was pedunculated. The polyp       was removed with a hot snare. There was a small piece of residual polyp       at the tip of the stalk and a short piece of the stalk was again removed       with cautery. Resection and retrieval were complete. To prevent bleeding       after the polypectomy, one hemostatic clip was successfully placed.       There was no bleeding at the end of the maneuver.      Five sessile polyps were found in the mid sigmoid colon and  distal       sigmoid colon. The polyps were 1 to 2 mm in size. These polyps were  removed with a cold biopsy forceps. Resection and retrieval were       complete.      The digital rectal exam was normal.      The digital rectal exam was normal. Impression:           - One 26 mm polyp in the mid sigmoid colon, removed                        with a hot snare. Clip was placed.                       - Five 1 to 2 mm polyps in the mid sigmoid colon and in                        the distal sigmoid colon, removed with a cold biopsy                        forceps. Resected and retrieved. Recommendation:       - Discharge patient to home.                       - Await pathology results. Procedure Code(s):    --- Professional ---                       [phone], Sigmoidoscopy, flexible; with removal of                        tumor(s), polyp(s), or other lesion(s) by snare                        technique                       [id number], 19, Sigmoidoscopy, flexible; with biopsy, single                        or multiple Diagnosis Code(s):    --- Professional ---                       D12.5, Benign neoplasm of sigmoid colon                       Z86.010, Personal history of colonic polyps CPT copyright 2016 American Medical Association. All rights reserved. The codes documented in this report are preliminary and upon coder review may  be revised to meet current compliance requirements. Lollie Sails, MD 02/11/2016 12:03:31 PM This report has been signed electronically. Number of Addenda: 0 Note Initiated On: 02/11/2016 11:17 AM Total Procedure Duration: 0 hours 25 minutes 42 seconds       [facility]

## 2016-02-11 NOTE — Anesthesia Postprocedure Evaluation (Signed)
Anesthesia Post Note  Patient: Natasha Mitchell  Procedure(s) Performed: Procedure(s) (LRB): COLONOSCOPY WITH PROPOFOL (N/A)  Patient location during evaluation: Endoscopy Anesthesia Type: General Level of consciousness: awake and alert Pain management: pain level controlled Vital Signs Assessment: post-procedure vital signs reviewed and stable Respiratory status: spontaneous breathing, nonlabored ventilation, respiratory function stable and patient connected to nasal cannula oxygen Cardiovascular status: blood pressure returned to baseline and stable Postop Assessment: no signs of nausea or vomiting Anesthetic complications: no    Last Vitals:  Filed Vitals:   02/11/16 1221 02/11/16 1231  BP: 122/78 115/70  Pulse: 67 73  Temp:    Resp: 10 15    Last Pain: There were no vitals filed for this visit.               Martha Clan

## 2016-02-11 NOTE — Transfer of Care (Signed)
Immediate Anesthesia Transfer of Care Note  Patient: Natasha Mitchell  Procedure(s) Performed: Procedure(s): COLONOSCOPY WITH PROPOFOL (N/A)  Patient Location: PACU and Endoscopy Unit  Anesthesia Type:General  Level of Consciousness: awake, oriented and patient cooperative  Airway & Oxygen Therapy: Patient Spontanous Breathing and Patient connected to nasal cannula oxygen  Post-op Assessment: Report given to RN and Post -op Vital signs reviewed and stable  Post vital signs: Reviewed and stable  Last Vitals:  Filed Vitals:   02/11/16 1042  BP: 135/73  Pulse: 77  Temp: 36.2 C  Resp: 16    Last Pain: There were no vitals filed for this visit.       Complications: No apparent anesthesia complications

## 2016-02-11 NOTE — H&P (Signed)
Outpatient short stay form Pre-procedure 02/11/2016 11:01 AM Natasha Sails MD  Primary Physician: Dr. Criselda Mitchell  Reason for visit:  Sedated flexible sigmoidoscopy  History of present illness:  Patient is a 50 year old female presenting today for sedated flexible sigmoidoscopy. She underwent a colonoscopy on 02/05/2016 with a finding of a large polyp estimated be about 4 cm or larger in the sigmoid colon. S2 the colon was otherwise normal. Multiple efforts at aligning to remove this large polyp were undertaken however were unsuccessful. As have a history of a large uterine fibroid which I believe makes access to this area of the sigmoid a little more difficult. Biopsies taken at that time were consistent with tubulovillous adenoma. There was no evidence of high-grade dysplasia or colon cancer. She is returning today for [place] to try to remove this lesion endoscopically.    Current facility-administered medications:  .  0.9 %  sodium chloride infusion, , Intravenous, Continuous, Natasha Sails, MD, Last Rate: 20 mL/hr at 02/11/16 1056, 1,000 mL at 02/11/16 1056 .  0.9 %  sodium chloride infusion, , Intravenous, Continuous, Natasha Sails, MD  Prescriptions prior to admission  Medication Sig Dispense Refill Last Dose  . Biotin 1000 MCG tablet Take by mouth.   02/10/2016 at Unknown time  . Cholecalciferol (VITAMIN D3) 1000 units CAPS Take by mouth.   Past Week at Unknown time  . citalopram (CELEXA) 20 MG tablet TAKE 1 TABLET BY MOUTH DAILY 30 tablet 3 02/11/2016 at 0720  . hydrochlorothiazide (HYDRODIURIL) 25 MG tablet Take 1 tablet (25 mg total) by mouth daily. 90 tablet 3 02/10/2016 at Unknown time  . iron polysaccharides (NIFEREX) 150 MG capsule Take 1 capsule (150 mg total) by mouth daily. 30 capsule 2 02/10/2016 at Unknown time  . Omega-3 Fatty Acids (FISH OIL PO) Take by mouth.   Past Week at Unknown time  . verapamil (COVERA HS) 180 MG (CO) 24 hr tablet Take 1 tablet (180 mg total) by  mouth at bedtime. 90 tablet 3 02/11/2016 at 0720     Allergies  Allergen Reactions  . Delsym Cough Relief [Dextromethorphan-Menthol] Shortness Of Breath  . Other Technical brewer  . Latex Rash  . Motrin [Ibuprofen] Rash     Past Medical History  Diagnosis Date  . Intermittent palpitations   . Varicose veins of legs   . Hypertension     Review of systems:      Physical Exam    Heart and lungs: Regular rate and rhythm without rub or gallop, lungs are bilaterally clear.    HEENT: Normocephalic atraumatic eyes are anicteric    Other:     Pertinant exam for procedure: Soft nontender nondistended bowel sounds positive normoactive. Easily palpable uterus is noted.    Planned proceedures: Sedated flexible sigmoidoscopy and indicated procedures. I have discussed the risks benefits and complications of procedures to include not limited to bleeding, infection, perforation and the risk of sedation and the patient wishes to proceed.    Natasha Sails, MD Gastroenterology 02/11/2016  11:01 AM

## 2016-02-11 NOTE — Anesthesia Preprocedure Evaluation (Addendum)
Anesthesia Evaluation  Patient identified by MRN, date of birth, ID band Patient awake    Reviewed: Allergy & Precautions, H&P , NPO status , Patient's Chart, lab work & pertinent test results, reviewed documented beta blocker date and time   History of Anesthesia Complications Negative for: history of anesthetic complications  Airway Mallampati: II  TM Distance: >3 FB Neck ROM: full    Dental no notable dental hx. (+) Partial Upper, Missing   Pulmonary neg shortness of breath, neg sleep apnea, neg COPD, neg recent URI, former smoker,    Pulmonary exam normal breath sounds clear to auscultation       Cardiovascular Exercise Tolerance: Good hypertension, (-) angina(-) CAD, (-) Past MI, (-) Cardiac Stents and (-) CABG Normal cardiovascular exam+ dysrhythmias (-) Valvular Problems/Murmurs Rhythm:regular Rate:Normal     Neuro/Psych negative neurological ROS  negative psych ROS   GI/Hepatic negative GI ROS, Neg liver ROS,   Endo/Other  negative endocrine ROS  Renal/GU negative Renal ROS  negative genitourinary   Musculoskeletal   Abdominal   Peds  Hematology negative hematology ROS (+)   Anesthesia Other Findings Past Medical History:   Intermittent palpitations                                    Varicose veins of legs                                       Hypertension                                                 Reproductive/Obstetrics negative OB ROS                             Anesthesia Physical Anesthesia Plan  ASA: II  Anesthesia Plan: General   Post-op Pain Management:    Induction:   Airway Management Planned:   Additional Equipment:   Intra-op Plan:   Post-operative Plan:   Informed Consent: I have reviewed the patients History and Physical, chart, labs and discussed the procedure including the risks, benefits and alternatives for the proposed anesthesia with the  patient or authorized representative who has indicated his/her understanding and acceptance.   Dental Advisory Given  Plan Discussed with: Anesthesiologist, CRNA and Surgeon  Anesthesia Plan Comments:         Anesthesia Quick Evaluation

## 2016-02-12 LAB — POCT PREGNANCY, URINE: PREG TEST UR: NEGATIVE

## 2016-02-12 LAB — SURGICAL PATHOLOGY

## 2016-02-13 ENCOUNTER — Ambulatory Visit (INDEPENDENT_AMBULATORY_CARE_PROVIDER_SITE_OTHER): Payer: 59

## 2016-02-13 DIAGNOSIS — N921 Excessive and frequent menstruation with irregular cycle: Secondary | ICD-10-CM | POA: Diagnosis not present

## 2016-02-13 DIAGNOSIS — D259 Leiomyoma of uterus, unspecified: Secondary | ICD-10-CM

## 2016-02-13 DIAGNOSIS — Z0289 Encounter for other administrative examinations: Secondary | ICD-10-CM

## 2016-02-13 LAB — PATHOLOGY

## 2016-02-13 LAB — PAP IG AND HPV HIGH-RISK
HPV, HIGH-RISK: NEGATIVE
PAP SMEAR COMMENT: 0

## 2016-02-15 ENCOUNTER — Encounter: Payer: Self-pay | Admitting: Family Medicine

## 2016-02-15 DIAGNOSIS — Z800 Family history of malignant neoplasm of digestive organs: Secondary | ICD-10-CM | POA: Insufficient documentation

## 2016-02-15 HISTORY — DX: Family history of malignant neoplasm of digestive organs: Z80.0

## 2016-02-15 NOTE — Assessment & Plan Note (Signed)
Patient reports anemia, low iron for quite some time; will check labs today, CBC, IBC panel, and hemoglobinopathy panel in case microcytosis independent of iron

## 2016-02-15 NOTE — Assessment & Plan Note (Signed)
Check A1c; positive fam hx of diabetes; weight loss would help slow/prevent progression to frank diabetes

## 2016-02-15 NOTE — Assessment & Plan Note (Signed)
So glad patient has colonoscopy coming up

## 2016-02-15 NOTE — Assessment & Plan Note (Signed)
Anxiety related to perimenopause, not depression or typical GAD; continue SSRI

## 2016-02-18 ENCOUNTER — Other Ambulatory Visit: Payer: 59

## 2016-02-18 ENCOUNTER — Encounter: Payer: 59 | Attending: Family Medicine | Admitting: *Deleted

## 2016-02-18 ENCOUNTER — Encounter: Payer: Self-pay | Admitting: Family Medicine

## 2016-02-18 ENCOUNTER — Encounter: Payer: Self-pay | Admitting: *Deleted

## 2016-02-18 VITALS — BP 126/82 | Ht 64.0 in | Wt 201.3 lb

## 2016-02-18 DIAGNOSIS — R7303 Prediabetes: Secondary | ICD-10-CM | POA: Diagnosis not present

## 2016-02-18 NOTE — Progress Notes (Signed)
Diabetes Self-Management Education  Visit Type: First/Initial  Appt. Start Time: 0900 Appt. End Time: 1010  02/18/2016  Ms. Natasha Mitchell, identified by name and date of birth, is a 50 y.o. female with a diagnosis of Diabetes: Pre-Diabetes.   ASSESSMENT  Blood pressure 126/82, height 5\' 4"  (1.626 m), weight 201 lb 4.8 oz (91.309 kg), last menstrual period 01/19/2016. Body mass index is 34.54 kg/(m^2).      Diabetes Self-Management Education - 02/18/16 1208    Visit Information   Visit Type First/Initial   Initial Visit   Diabetes Type Pre-Diabetes   Are you currently following a meal plan? No   Are you taking your medications as prescribed? Yes   Date Diagnosed 2016   Health Coping   How would you rate your overall health? Good   Psychosocial Assessment   Patient Belief/Attitude about Diabetes Other (comment)  "it feels terrible"   Self-care barriers None   Self-management support Doctor's office;Family   Patient Concerns Nutrition/Meal planning;Glycemic Control;Weight Control;Healthy Lifestyle;Problem Solving   Special Needs None   Preferred Learning Style Auditory;Visual;Hands on   [place] in progress   How often do you need to have someone help you when you read instructions, pamphlets, or other written materials from your doctor or pharmacy? 1 - Never   What is the last grade level you completed in school? 2 years college   Pre-Education Assessment   Patient understands the diabetes disease and treatment process. Needs Review   Patient understands incorporating nutritional management into lifestyle. Needs Review   Patient undertands incorporating physical activity into lifestyle. Needs Review   Patient understands using medications safely. Needs Instruction   Patient understands monitoring blood glucose, interpreting and using results Needs Review   Patient understands prevention, detection, and treatment of acute complications. Needs Instruction    Patient understands prevention, detection, and treatment of chronic complications. Needs Review   Patient understands how to develop strategies to address psychosocial issues. Needs Instruction   Patient understands how to develop strategies to promote health/change behavior. Needs Instruction   Complications   Last HgB A1C per patient/outside source 5.9 %  01/21/16   How often do you check your blood sugar? 0 times/day (not testing)  Provided True Metrix meter and instructed on use. BG upon return demonstration was 97 mg/dL at 9:55 am - fasting.    Have you had a dilated eye exam in the past 12 months? Yes   Have you had a dental exam in the past 12 months? Yes   Are you checking your feet? Yes   How many days per week are you checking your feet? 7   Dietary Intake   Breakfast protein shake including 1 cup fruit   Snack (morning) peanut butter or cheese crackers   Lunch salad with egg or chicken, vegetables   Snack (afternoon) protein bar   Dinner baked chicken and vegetables   Snack (evening) jello, sugar pudding   Beverage(s) water, coffee   Exercise   Exercise Type Moderate (swimming / aerobic walking)  gym, walking   How many days per week to you exercise? 2   How many minutes per day do you exercise? 90   Total minutes per week of exercise 180   Patient Education   Previous Diabetes Education Yes (please comment)  Pt is nurse tech    Disease state  Definition of diabetes, type 1 and 2, and the diagnosis of diabetes   Nutrition management  Role of diet in  the treatment of diabetes and the relationship between the three main macronutrients and blood glucose level;Carbohydrate counting   Physical activity and exercise  Role of exercise on diabetes management, blood pressure control and cardiac health.   Monitoring Taught/evaluated SMBG meter.;Purpose and frequency of SMBG.;Identified appropriate SMBG and/or A1C goals.   Chronic complications Relationship between chronic  complications and blood glucose control   Psychosocial adjustment Identified and addressed patients feelings and concerns about diabetes   Individualized Goals (developed by patient)   Reducing Risk Improve blood sugars Prevent diabetes complications Lose weight Lead a healthier lifestyle Become more fit   Outcomes   Expected Outcomes Demonstrated interest in learning. Expect positive outcomes      Individualized Plan for Diabetes Self-Management Training:   Learning Objective:  Patient will have a greater understanding of diabetes self-management. Patient education plan is to attend individual and/or group sessions per assessed needs and concerns.   Plan:   Patient Instructions  Check blood sugars 2 x day before breakfast and 2 hrs after supper 3 x week Exercise: Continue program  for  60-90 minutes   2-3 days a week Eat 3 meals day, 2-3  snacks a day Space meals 4-6 hours apart Bring blood sugar records to the next class Call your doctor for a prescription for:  1. Meter strips (type) True Metrix  checking   3   times per week  2. Lancets (type) True Metrix  checking   3   times per week   Expected Outcomes:  Demonstrated interest in learning. Expect positive outcomes  Education material provided:  General Meal Planning Guidelines Simple Meal Plan Meter - True Metrix  If problems or questions, patient to contact team via:   Johny Drilling, RN, [place], CDE [phone]  Future DSME appointment:  Pt to check her work calendar and call back to schedule classes.

## 2016-02-18 NOTE — Patient Instructions (Addendum)
Check blood sugars 2 x day before breakfast and 2 hrs after supper 3 x week Exercise: Continue program  for  60-90 minutes   2-3 days a week Eat 3 meals day, 2-3  snacks a day Space meals 4-6 hours apart Bring blood sugar records to the next class Call your doctor for a prescription for:  1. Meter strips (type) True Metrix  checking   3   times per week  2. Lancets (type) True Metrix  checking   3   times per week

## 2016-02-25 ENCOUNTER — Encounter: Payer: Self-pay | Admitting: Gastroenterology

## 2016-02-28 ENCOUNTER — Telehealth: Payer: Self-pay | Admitting: *Deleted

## 2016-02-28 NOTE — Telephone Encounter (Signed)
Phone call to follow up with patient on scheduling diabetes classes. Left message to call back.

## 2016-03-01 ENCOUNTER — Encounter: Payer: Self-pay | Admitting: Obstetrics and Gynecology

## 2016-03-11 ENCOUNTER — Ambulatory Visit (INDEPENDENT_AMBULATORY_CARE_PROVIDER_SITE_OTHER): Payer: 59 | Admitting: Obstetrics and Gynecology

## 2016-03-11 ENCOUNTER — Encounter: Payer: Self-pay | Admitting: Obstetrics and Gynecology

## 2016-03-11 VITALS — BP 121/73 | HR 73 | Ht 63.0 in | Wt 204.2 lb

## 2016-03-11 DIAGNOSIS — D259 Leiomyoma of uterus, unspecified: Secondary | ICD-10-CM | POA: Diagnosis not present

## 2016-03-11 DIAGNOSIS — N951 Menopausal and female climacteric states: Secondary | ICD-10-CM | POA: Diagnosis not present

## 2016-03-11 DIAGNOSIS — N921 Excessive and frequent menstruation with irregular cycle: Secondary | ICD-10-CM

## 2016-03-11 NOTE — Patient Instructions (Signed)
You are scheduled for surgery on 03/23/2016.  Nothing to eat after midnight on day prior to surgery.  Do not take any medications unless recommended by your provider on day prior to surgery.  Do not take NSAIDs (Motrin, Aleve) or aspirin 7 days prior to surgery.  You may take Tylenol products for minor aches and pains.  You will receive a prescription for pain medications post-operatively.  You will be contacted by phone approximately 1 week prior to surgery to schedule pre-operative appointment.  Please call the office if you have any questions regarding your upcoming surgery.

## 2016-03-16 NOTE — Progress Notes (Addendum)
GYNECOLOGY PROGRESS NOTE  Subjective:    Patient ID: Natasha Mitchell, female    DOB: 06/15/1966, 50 y.o.   MRN: [id number]  HPI  Patient is a 50 y.o. G64P3003 female who presents for follow up of results of ultrasound and endometrial biopsy performed for perimenopausal menometrorrhagia with h/o fibroid uterus.  Patient also is s/p endoscopy with polyp removed, benign findings noted. Desires to continue to discuss plans for hysterectomy.   The following portions of the patient's history were reviewed and updated as appropriate: allergies, current medications, past family history, past medical history, past social history, past surgical history and problem list.  Review of Systems Pertinent items noted in HPI and remainder of comprehensive ROS otherwise negative.   Objective:   Blood pressure 121/73, pulse 73, height 5\' 3"  (1.6 m), weight 204 lb 3.2 oz (92.625 kg), last menstrual period 03/10/2016. General appearance: alert and no distress Exam deferred.    Labs:  02/06/2016: Pap smear   02/06/2016: ENDOMETRIUM, BIOPSY BENIGN ENDOMETRIAL POLYP. SECRETORY ENDOMETRIUM, DAY 21-22, POST-OVULATORY DAY 7-8. NO EVIDENCE OF HYPERPLASIA OR ENDOMETRIAL INTRAEPITHELIAL NEOPLASIA  . Imaging (02/13/2016):  Indications:Enlarged Uterus and AUB Findings:  The uterus measures 13.6 x 12.5 x 10.1cm . Echo texture is heterogenous with evidence of focal masses. Within the uterus are multiple suspected fibroids (atleast 7) with the largest measuring: Fibroid 1: Posterior Fundus 6.7 x 5.2 x 6.5cm. Fibroid 2: Anterior Fundus 5.4 x 3.5 x 4.2cm. Fibroid 3: Posterior Lower Uterine Segment 5.1 x 3.9 x 4.4cm The Endometrium is obscured by multiple fibroids.  Right Ovary measures 3.3 x 2 x 2.3 cm. It is normal in appearance. Left Ovary measures 3.3 x 2.5 x 2.4 cm. It is normal appearance. Survey of the adnexa demonstrates no adnexal masses. There is no free fluid in the cul de sac.  Impression: 1.  Fibroid Uterus  Recommendations: 1.Clinical correlation with the patient's History and Physical Exam.  Assessment:   Menometrorrhagia Fibroid uterus  Plan:   - Discussed pap smear and endometrial biopsy results. Benign workup other than fibroid uterus.  - Discussed management options for abnormal uterine bleeding again including tranexamic acid (Lysteda), oral progesterone (Megace), Depo Provera, Mirena IUD, endometrial ablation (Novasure/Hydrothermal Ablation) or hysterectomy as definitive surgical management.  Discussed risks and benefits of each method.   Patient desires definitive management with hysterectomy.  Printed patient education handouts were given to the patient to review at home. Patient desires to retain ovaries if disease free.  - The risks of surgery were discussed in detail with the patient including but not limited to: bleeding which may require transfusion or reoperation; infection which may require prolonged hospitalization or re-hospitalization and antibiotic therapy; injury to bowel, bladder, ureters and major vessels or other surrounding organs; need for additional procedures including laparotomy; thromboembolic phenomenon, incisional problems and other postoperative or anesthesia complications.  Patient was told that the likelihood that her condition and symptoms will be treated effectively with this surgical management was very high; the postoperative expectations were also discussed in detail. The patient also understands the alternative treatment options which were discussed in full. All questions were answered.  She was told that she will be contacted by our surgical scheduler regarding the time and date of her surgery; routine preoperative instructions of having nothing to eat or drink after midnight on the day prior to surgery and also coming to the hospital 1.5 hours prior to her time of surgery were also emphasized.  She was told she may  be called for a preoperative  appointment about a week prior to surgery and will be given further preoperative instructions at that visit. Printed patient education handouts about the procedure were given to the patient to review at home.  To be scheduled for TAH, and bilateral salpingectomy on 03/23/2016.    A total of 25 minutes were spent face-to-face with the patient during this encounter and over half of that time involved counseling and coordination of care.   Rubie Maid, MD [facility]

## 2016-03-16 NOTE — H&P (Signed)
GYNECOLOGY HISTORY AND PHYSICAL   Subjective:    Patient is a 50 y.o. G18P3003 female scheduled for Total Abdominal Hysterectomy with bilateral salpingectomy. Indications for procedure are menometrorrhagia (abnormal perimenopausal bleeding), fibroid uterus.   Pertinent Gynecological History: Menses: flow is excessive with use of 7-8 pads or tampons on heaviest days, flow is moderate and regular every month with spotting approximately 3-5 days per month. Bleeding: intermenstrual bleeding Contraception: tubal ligation Last mammogram: normal Date: 08/09/2015 Last pap: normal Date: 02/06/2016  Discussed Blood/Blood Products: yes   Menstrual History: Menarche age: 15  Patient's last menstrual period was 03/10/2016.     OB History  Gravida Para Term Preterm AB SAB TAB Ectopic Multiple Living  3 3 3       3     # Outcome Date GA Lbr Len/2nd Weight Sex Delivery Anes PTL Lv  3 Term      Vag-Spont   Y  2 Term      Vag-Spont   Y  1 Term      Vag-Spont   Y      Past Medical History  Diagnosis Date  . Intermittent palpitations   . Varicose veins of legs   . Hypertension   . Family hx of colon cancer 02/15/2016  . Prediabetes     Past Surgical History  Procedure Laterality Date  . Tubal ligation  1998  . Mouth surgery  2001  . Ablation saphenous vein w/ rfa Bilateral     femoral vein ([facility])  . Colonoscopy with propofol N/A 02/11/2016    Procedure: COLONOSCOPY WITH PROPOFOL;  Surgeon: Lollie Sails, MD;  Location: [facility] ENDOSCOPY;  Service: Endoscopy;  Laterality: N/A;    Family History  Problem Relation Age of Onset  . Birth defects Mother     ablation in heart  . Hearing loss Father     Social History   Social History  . Marital Status: Single    Spouse Name: N/A  . Number of Children: N/A  . Years of Education: N/A   Social History Main Topics  . Smoking status: Former Smoker -- 1.50 packs/day for 15 years    Types: Cigarettes    Quit  date: 10/06/1999  . Smokeless tobacco: Never Used     Comment: quit in 2001  . Alcohol Use: 0.6 - 1.2 oz/week    1-2 Standard drinks or equivalent per week     Comment: occasional beer  . Drug Use: No  . Sexual Activity: No   Other Topics Concern  . None   Social History Narrative    Allergies  Allergen Reactions  . Delsym Cough Relief [Dextromethorphan-Menthol] Shortness Of Breath  . Other Technical brewer  . Latex Rash  . Motrin [Ibuprofen] Rash    Review of Systems Constitutional: No recent fever/chills/sweats Respiratory: No recent cough/bronchitis Cardiovascular: No chest pain Gastrointestinal: No recent nausea/vomiting/diarrhea Genitourinary: No UTI symptoms Hematologic/lymphatic:No history of coagulopathy or recent blood thinner use    Objective:    BP 121/73 mmHg  Pulse 73  Ht 5\' 3"  (1.6 m)  Wt 204 lb 3.2 oz (92.625 kg)  BMI 36.18 kg/m2  LMP 03/10/2016  General:   Normal  Skin:   normal  HEENT:  Normal  Neck:  Supple without Adenopathy or Thyromegaly  Lungs:   Heart:              Breasts:   Abdomen:  :  Pelvis  M/S   Extremeties:  Neuro:    clear to auscultation bilaterally   Normal without murmur   Not Examined   soft, non-tender; bowel sounds normal,  no organomegaly, palpable mass approximately 3-4 cm below umbilicus.    external genitalia normal, rectovaginal septum normal and vagina normal without discharge. Cervix appears normal, deviated up and to the right. Uterus enlargement, 16 week size, mobile, nontender.   No CVAT  Warm/Dry  Normal           Lab Results  Component Value Date   WBC 5.4 01/21/2016   HGB 11.0* 08/21/2014   HCT 34.9 01/21/2016   MCV 68* 01/21/2016   PLT 531* 01/21/2016   Lab Results  Component Value Date   TSH 0.705 06/18/2015    Labs:  02/06/2016: Pap smear   02/06/2016: ENDOMETRIUM, BIOPSY BENIGN ENDOMETRIAL POLYP. SECRETORY ENDOMETRIUM, DAY 21-22, POST-OVULATORY DAY 7-8. NO  EVIDENCE OF HYPERPLASIA OR ENDOMETRIAL INTRAEPITHELIAL NEOPLASIA  . Imaging (02/13/2016):  Indications:Enlarged Uterus and AUB Findings:  The uterus measures 13.6 x 12.5 x 10.1cm . Echo texture is heterogenous with evidence of focal masses. Within the uterus are multiple suspected fibroids (atleast 7) with the largest measuring: Fibroid 1: Posterior Fundus 6.7 x 5.2 x 6.5cm. Fibroid 2: Anterior Fundus 5.4 x 3.5 x 4.2cm. Fibroid 3: Posterior Lower Uterine Segment 5.1 x 3.9 x 4.4cm The Endometrium is obscured by multiple fibroids.  Right Ovary measures 3.3 x 2 x 2.3 cm. It is normal in appearance. Left Ovary measures 3.3 x 2.5 x 2.4 cm. It is normal appearance. Survey of the adnexa demonstrates no adnexal masses. There is no free fluid in the cul de sac.  Impression: 1. Fibroid Uterus  Recommendations: 1.Clinical correlation with the patient's History and Physical Exam.   Assessment:    Fibroid uterus Menometrorrhagia (perimenopausal abnormal bleeding)   Plan:  - Discussed management options for abnormal uterine bleeding again including tranexamic acid (Lysteda), oral progesterone (Megace), Depo Provera, Mirena IUD, endometrial ablation (Novasure/Hydrothermal Ablation) or hysterectomy as definitive surgical management. Discussed risks and benefits of each method. Patient desires definitive management with hysterectomy. Printed patient education handouts were given to the patient to review at home. Patient desires to retain ovaries if disease free.  - Counseling: Procedure, risks, reasons, benefits and complications (including injury to bowel, bladder, major blood vessel, ureter, bleeding, possibility of transfusion, infection, or fistula formation) reviewed in detail.  - Preop testing ordered.  Surgery to be scheduled for 03/23/2016.  - Instructions reviewed, including NPO after midnight.       Rubie Maid, MD [facility]

## 2016-03-19 ENCOUNTER — Telehealth: Payer: Self-pay | Admitting: *Deleted

## 2016-03-19 NOTE — Telephone Encounter (Signed)
Pt didn't show for Diabetes Class 1. She left a message that she would not be able to attend and wants to reschedule. Called her and left a message with different dates and asked her to confirm when she could come.

## 2016-03-30 ENCOUNTER — Encounter: Payer: Self-pay | Admitting: Dietician

## 2016-03-30 ENCOUNTER — Encounter: Payer: 59 | Attending: Family Medicine | Admitting: Dietician

## 2016-03-30 VITALS — Ht 64.0 in | Wt 205.5 lb

## 2016-03-30 DIAGNOSIS — R7303 Prediabetes: Secondary | ICD-10-CM | POA: Insufficient documentation

## 2016-03-30 NOTE — Progress Notes (Signed)

## 2016-04-01 ENCOUNTER — Ambulatory Visit (INDEPENDENT_AMBULATORY_CARE_PROVIDER_SITE_OTHER): Payer: 59 | Admitting: Obstetrics and Gynecology

## 2016-04-01 ENCOUNTER — Encounter: Payer: 59 | Admitting: Obstetrics and Gynecology

## 2016-04-01 ENCOUNTER — Encounter: Payer: Self-pay | Admitting: Obstetrics and Gynecology

## 2016-04-01 ENCOUNTER — Encounter
Admission: RE | Admit: 2016-04-01 | Discharge: 2016-04-01 | Disposition: A | Discharge status: Home / Self Care | Payer: 59 | Source: Ambulatory Visit | Attending: Obstetrics and Gynecology | Admitting: Obstetrics and Gynecology

## 2016-04-01 VITALS — BP 121/73 | HR 56 | Ht 63.0 in | Wt 207.0 lb

## 2016-04-01 DIAGNOSIS — N924 Excessive bleeding in the premenopausal period: Secondary | ICD-10-CM

## 2016-04-01 DIAGNOSIS — Z01812 Encounter for preprocedural laboratory examination: Secondary | ICD-10-CM | POA: Insufficient documentation

## 2016-04-01 DIAGNOSIS — D259 Leiomyoma of uterus, unspecified: Secondary | ICD-10-CM

## 2016-04-01 DIAGNOSIS — Z862 Personal history of diseases of the blood and blood-forming organs and certain disorders involving the immune mechanism: Secondary | ICD-10-CM

## 2016-04-01 DIAGNOSIS — N921 Excessive and frequent menstruation with irregular cycle: Secondary | ICD-10-CM

## 2016-04-01 HISTORY — DX: Asymptomatic varicose veins of unspecified lower extremity: I83.90

## 2016-04-01 HISTORY — DX: Anxiety disorder, unspecified: F41.9

## 2016-04-01 LAB — BASIC METABOLIC PANEL
ANION GAP: 6 (ref 5–15)
BUN: 17 mg/dL (ref 6–20)
CHLORIDE: 102 mmol/L (ref 101–111)
CO2: 29 mmol/L (ref 22–32)
Calcium: 8.9 mg/dL (ref 8.9–10.3)
Creatinine, Ser: 0.79 mg/dL (ref 0.44–1.00)
GFR calc Af Amer: >60 mL/min (ref >60)
Glucose, Bld: 96 mg/dL (ref 65–99)
POTASSIUM: 3.6 mmol/L (ref 3.5–5.1)
SODIUM: 137 mmol/L (ref 135–145)

## 2016-04-01 LAB — CBC
HEMATOCRIT: 36 % (ref 35.0–47.0)
HEMOGLOBIN: 11.6 g/dL — AB (ref 12.0–16.0)
MCH: 22 pg — ABNORMAL LOW (ref 26.0–34.0)
MCHC: 32.3 g/dL (ref 32.0–36.0)
MCV: 68.1 fL — AB (ref 80.0–100.0)
Platelets: 362 10*3/uL (ref 150–440)
RBC: 5.29 MIL/uL — AB (ref 3.80–5.20)
RDW: 18.9 % — ABNORMAL HIGH (ref 11.5–14.5)
WBC: 6.2 10*3/uL (ref 3.6–11.0)

## 2016-04-01 LAB — RAPID HIV SCREEN (HIV 1/2 AB+AG)
HIV 1/2 Antibodies: NONREACTIVE
HIV-1 P24 Antigen - HIV24: NONREACTIVE

## 2016-04-01 NOTE — Progress Notes (Signed)
    GYNECOLOGY PROGRESS NOTE  Subjective:    Patient ID: Tommy Rainwater Kitchen, female    DOB: 01/04/1966, 50 y.o.   MRN: [id number]  HPI  Patient is a 50 y.o. G57P3003 female who presents for pre-operative exam for scheduled TAH with bilateral salpingectomy.  Indications for surgery include perimenopausal menometrorrhagia, h/o anemia, enlarged fibroid uterus. Notes menses started today.   The following portions of the patient's history were reviewed and updated as appropriate: allergies, current medications, past family history, past medical history, past social history, past surgical history and problem list.  Review of Systems Pertinent items noted in HPI and remainder of comprehensive ROS otherwise negative.   Objective:   Blood pressure 121/73, pulse 56, height 5\' 3"  (1.6 m), weight 207 lb (93.895 kg), last menstrual period 04/01/2016. General appearance: alert and no distress Abdomen:normal findings: no organomegaly, no scars, striae, dilated veins, rashes, or lesions and soft, non-tender and abnormal findings: mass, located in the lower abdomen  Pelvic:external genitalia normal, rectovaginal septum normal and vagina normal without discharge. Cervix appears normal, deviated up and to the right. Uterus enlargement, 16 week size, mobile, nontender. Adnexae without tenderness, non-palpable.  Extremities: extremities normal, atraumatic, no cyanosis or edema Neurologic: Grossly normal   Assessment:   Menometrorrhagia  Perimenopausal bleeding  Uterine fibroids   H/o anemia  Plan:   - Counseling: Again reiterated procedure, risks, reasons, benefits and complications (including injury to bowel, bladder, major blood vessel, ureter, bleeding, possibility of transfusion, infection, or fistula formation) reviewed in detail. Consent forms signed.  - Preop testing ordered. Surgery to be scheduled for 04/06/2016.    Rubie Maid, MD [facility] 04/01/2016 2:45  PM

## 2016-04-01 NOTE — Pre-Procedure Instructions (Signed)
Pt given incentive spirometer along with instructions, pt returned demonstration.

## 2016-04-01 NOTE — Patient Instructions (Signed)
  Your procedure is scheduled on: Monday April 06, 2016. Report to Same Day Surgery. To find out your arrival time please call [phone] between 1PM - 3PM on Friday April 03, 2016.  Remember: Instructions that are not followed completely may result in serious medical risk, up to and including death, or upon the discretion of your surgeon and anesthesiologist your surgery may need to be rescheduled.    _x___ 1. Do not eat food or drink liquids after midnight. No gum chewing or hard candies.     __x__ 2. No Alcohol for 24 hours before or after surgery.   ____ 3. Bring all medications with you on the day of surgery if instructed.    __x__ 4. Notify your doctor if there is any change in your medical condition     (cold, fever, infections).     Do not wear jewelry, make-up, hairpins, clips or nail polish.  Do not wear lotions, powders, or perfumes. You may wear deodorant.  Do not shave 48 hours prior to surgery. Men may shave face and neck.  Do not bring valuables to the hospital.    [facility] is not responsible for any belongings or valuables.               Contacts, dentures or bridgework may not be worn into surgery.  Leave your suitcase in the car. After surgery it may be brought to your room.  For patients admitted to the hospital, discharge time is determined by your treatment team.   Patients discharged the day of surgery will not be allowed to drive home.    Please read over the following fact sheets that you were given:   [facility] Preparing for Surgery  __x__ Take these medicines the morning of surgery with A SIP OF WATER:    1. citalopram (CELEXA)  2. verapamil (COVERA HS)    ____ Fleet Enema (as directed)   _x___ Use CHG Soap as directed on instruction sheet  ____ Use inhalers on the day of surgery and bring to hospital day of surgery  ____ Stop metformin 2 days prior to surgery    ____ Take 1/2 of usual insulin dose the night before surgery and none on the  morning of  surgery.   ____ Stop Coumadin/Plavix/aspirin on does not apply.  __x__ Stop Anti-inflammatories such as Advil, Aleve, Ibuprofen, Motrin, Naproxen, Naprosyn, Goodies powders or aspirin products. OK to tylenol.   __x__ Stop supplements: Biotin, Cholecalciferol (VITAMIN D3), Cyanocobalamin (VITAMIN B-12 PO),Omega-3 Fatty Acids (FISH OIL PO),vitamin E   until after surgery.    ____ Bring C-Pap to the hospital.

## 2016-04-02 LAB — RPR: RPR: NONREACTIVE

## 2016-04-02 LAB — HEPATITIS B SURFACE ANTIGEN: Hepatitis B Surface Ag: NEGATIVE

## 2016-04-02 LAB — HEPATITIS C ANTIBODY: HCV Ab: <0.1 s/co ratio (ref 0.0–0.9)

## 2016-04-03 ENCOUNTER — Other Ambulatory Visit: Payer: Self-pay

## 2016-04-03 MED ORDER — CITALOPRAM HYDROBROMIDE 20 MG PO TABS: 20.0000 mg | ORAL_TABLET | Freq: Every day | ORAL | Status: DC

## 2016-04-06 ENCOUNTER — Inpatient Hospital Stay
Admission: RE | Admit: 2016-04-06 | Discharge: 2016-04-09 | DRG: 743 | Disposition: A | Discharge status: Home / Self Care | Payer: 59 | Source: Ambulatory Visit | Attending: Obstetrics and Gynecology | Admitting: Obstetrics and Gynecology

## 2016-04-06 ENCOUNTER — Encounter: Payer: Self-pay | Admitting: *Deleted

## 2016-04-06 ENCOUNTER — Inpatient Hospital Stay: Payer: 59 | Admitting: Anesthesiology

## 2016-04-06 ENCOUNTER — Encounter
Admission: RE | Disposition: A | Discharge status: Home / Self Care | Payer: Self-pay | Source: Ambulatory Visit | Attending: Obstetrics and Gynecology

## 2016-04-06 DIAGNOSIS — N939 Abnormal uterine and vaginal bleeding, unspecified: Secondary | ICD-10-CM | POA: Diagnosis not present

## 2016-04-06 DIAGNOSIS — I10 Essential (primary) hypertension: Secondary | ICD-10-CM | POA: Diagnosis present

## 2016-04-06 DIAGNOSIS — F419 Anxiety disorder, unspecified: Secondary | ICD-10-CM | POA: Diagnosis present

## 2016-04-06 DIAGNOSIS — N924 Excessive bleeding in the premenopausal period: Secondary | ICD-10-CM | POA: Diagnosis present

## 2016-04-06 DIAGNOSIS — D252 Subserosal leiomyoma of uterus: Secondary | ICD-10-CM | POA: Diagnosis not present

## 2016-04-06 DIAGNOSIS — D259 Leiomyoma of uterus, unspecified: Secondary | ICD-10-CM | POA: Diagnosis present

## 2016-04-06 DIAGNOSIS — Z87891 Personal history of nicotine dependence: Secondary | ICD-10-CM

## 2016-04-06 DIAGNOSIS — F329 Major depressive disorder, single episode, unspecified: Secondary | ICD-10-CM | POA: Diagnosis present

## 2016-04-06 DIAGNOSIS — N921 Excessive and frequent menstruation with irregular cycle: Secondary | ICD-10-CM | POA: Diagnosis not present

## 2016-04-06 DIAGNOSIS — D251 Intramural leiomyoma of uterus: Secondary | ICD-10-CM | POA: Diagnosis not present

## 2016-04-06 HISTORY — PX: HYSTERECTOMY ABDOMINAL WITH SALPINGECTOMY: SHX6725

## 2016-04-06 LAB — TYPE AND SCREEN
ABO/RH(D): O NEG
ANTIBODY SCREEN: NEGATIVE

## 2016-04-06 LAB — POCT PREGNANCY, URINE: PREG TEST UR: NEGATIVE

## 2016-04-06 SURGERY — HYSTERECTOMY, TOTAL, ABDOMINAL, WITH SALPINGECTOMY
Anesthesia: General | Laterality: Bilateral | Wound class: Clean Contaminated

## 2016-04-06 MED ORDER — MORPHINE SULFATE (PF) 2 MG/ML IV SOLN
2.0000 mg | INTRAVENOUS | Status: DC | PRN
  Administered 2016-04-06: 2 mg via INTRAVENOUS

## 2016-04-06 MED ORDER — ACETAMINOPHEN 10 MG/ML IV SOLN
INTRAVENOUS | Status: DC | PRN
  Administered 2016-04-06: 1000 mg via INTRAVENOUS

## 2016-04-06 MED ORDER — BISACODYL 10 MG RE SUPP: 10.0000 mg | Freq: Every day | RECTAL | Status: DC | PRN

## 2016-04-06 MED ORDER — PROPOFOL 10 MG/ML IV BOLUS
INTRAVENOUS | Status: DC | PRN
  Administered 2016-04-06: 180 mg via INTRAVENOUS

## 2016-04-06 MED ORDER — FENTANYL CITRATE (PF) 100 MCG/2ML IJ SOLN
INTRAMUSCULAR | Status: DC | PRN
  Administered 2016-04-06 (×3): 50 ug via INTRAVENOUS

## 2016-04-06 MED ORDER — LACTATED RINGERS IV SOLN: INTRAVENOUS | Status: DC

## 2016-04-06 MED ORDER — ONDANSETRON HCL 4 MG/2ML IJ SOLN
INTRAMUSCULAR | Status: DC | PRN
  Administered 2016-04-06: 4 mg via INTRAVENOUS

## 2016-04-06 MED ORDER — ACETAMINOPHEN 325 MG PO TABS: 650.0000 mg | ORAL_TABLET | Freq: Four times a day (QID) | ORAL | Status: DC | PRN

## 2016-04-06 MED ORDER — CEFAZOLIN SODIUM-DEXTROSE 2-4 GM/100ML-% IV SOLN
INTRAVENOUS | Status: AC
  Filled 2016-04-06: qty 100

## 2016-04-06 MED ORDER — LACTATED RINGERS IV SOLN
INTRAVENOUS | Status: DC
  Administered 2016-04-06 (×2): via INTRAVENOUS

## 2016-04-06 MED ORDER — CEFAZOLIN SODIUM-DEXTROSE 2-4 GM/100ML-% IV SOLN
2.0000 g | Freq: Once | INTRAVENOUS | Status: AC
  Administered 2016-04-06: 2 g via INTRAVENOUS

## 2016-04-06 MED ORDER — ONDANSETRON HCL 4 MG/2ML IJ SOLN
4.0000 mg | Freq: Four times a day (QID) | INTRAMUSCULAR | Status: DC | PRN
  Administered 2016-04-07: 4 mg via INTRAVENOUS
  Filled 2016-04-06: qty 2

## 2016-04-06 MED ORDER — ONDANSETRON HCL 4 MG/2ML IJ SOLN: 4.0000 mg | Freq: Once | INTRAMUSCULAR | Status: DC | PRN

## 2016-04-06 MED ORDER — FAMOTIDINE 20 MG PO TABS
ORAL_TABLET | ORAL | Status: AC
  Filled 2016-04-06: qty 1

## 2016-04-06 MED ORDER — MORPHINE SULFATE (PF) 2 MG/ML IV SOLN: 2.0000 mg | Freq: Once | INTRAVENOUS | Status: DC

## 2016-04-06 MED ORDER — PANTOPRAZOLE SODIUM 40 MG PO TBEC
40.0000 mg | DELAYED_RELEASE_TABLET | Freq: Every day | ORAL | Status: DC
  Administered 2016-04-06 – 2016-04-09 (×4): 40 mg via ORAL
  Filled 2016-04-06 (×4): qty 1

## 2016-04-06 MED ORDER — MORPHINE SULFATE (PF) 2 MG/ML IV SOLN
2.0000 mg | Freq: Once | INTRAVENOUS | Status: AC
  Administered 2016-04-06: 2 mg via INTRAVENOUS

## 2016-04-06 MED ORDER — HYDROCHLOROTHIAZIDE 25 MG PO TABS
25.0000 mg | ORAL_TABLET | Freq: Every day | ORAL | Status: DC
  Administered 2016-04-07 – 2016-04-09 (×3): 25 mg via ORAL
  Filled 2016-04-06 (×3): qty 1

## 2016-04-06 MED ORDER — GLYCOPYRROLATE 0.2 MG/ML IJ SOLN
INTRAMUSCULAR | Status: DC | PRN
  Administered 2016-04-06: 0.2 mg via INTRAVENOUS

## 2016-04-06 MED ORDER — FENTANYL CITRATE (PF) 100 MCG/2ML IJ SOLN
INTRAMUSCULAR | Status: AC
  Administered 2016-04-06: 25 ug via INTRAVENOUS
  Filled 2016-04-06: qty 2

## 2016-04-06 MED ORDER — FAMOTIDINE 20 MG PO TABS
20.0000 mg | ORAL_TABLET | Freq: Once | ORAL | Status: AC
  Administered 2016-04-06: 20 mg via ORAL

## 2016-04-06 MED ORDER — LACTATED RINGERS IV SOLN
INTRAVENOUS | Status: DC
  Administered 2016-04-06 – 2016-04-07 (×4): via INTRAVENOUS

## 2016-04-06 MED ORDER — DOCUSATE SODIUM 100 MG PO CAPS
100.0000 mg | ORAL_CAPSULE | Freq: Two times a day (BID) | ORAL | Status: DC
  Administered 2016-04-06 – 2016-04-09 (×7): 100 mg via ORAL
  Filled 2016-04-06 (×7): qty 1

## 2016-04-06 MED ORDER — HYDROMORPHONE HCL 1 MG/ML IJ SOLN
0.2500 mg | INTRAMUSCULAR | Status: DC | PRN
  Administered 2016-04-06 (×7): 0.25 mg via INTRAVENOUS

## 2016-04-06 MED ORDER — DEXAMETHASONE SODIUM PHOSPHATE 10 MG/ML IJ SOLN
INTRAMUSCULAR | Status: DC | PRN
  Administered 2016-04-06: 5 mg via INTRAVENOUS

## 2016-04-06 MED ORDER — HYDROMORPHONE HCL 1 MG/ML IJ SOLN
INTRAMUSCULAR | Status: AC
  Administered 2016-04-06: 0.25 mg via INTRAVENOUS
  Filled 2016-04-06: qty 1

## 2016-04-06 MED ORDER — LIDOCAINE 5 % EX PTCH
MEDICATED_PATCH | CUTANEOUS | Status: AC
  Filled 2016-04-06: qty 1

## 2016-04-06 MED ORDER — MORPHINE SULFATE (PF) 10 MG/ML IV SOLN
INTRAVENOUS | Status: AC
  Administered 2016-04-06: 2 mg via INTRAVENOUS
  Filled 2016-04-06: qty 1

## 2016-04-06 MED ORDER — ONDANSETRON HCL 4 MG PO TABS: 4.0000 mg | ORAL_TABLET | Freq: Four times a day (QID) | ORAL | Status: DC | PRN

## 2016-04-06 MED ORDER — MORPHINE SULFATE (PF) 2 MG/ML IV SOLN: 2.0000 mg | INTRAVENOUS | Status: DC | PRN

## 2016-04-06 MED ORDER — SENNOSIDES-DOCUSATE SODIUM 8.6-50 MG PO TABS: 1.0000 | ORAL_TABLET | Freq: Every evening | ORAL | Status: DC | PRN

## 2016-04-06 MED ORDER — EPHEDRINE SULFATE 50 MG/ML IJ SOLN
INTRAMUSCULAR | Status: DC | PRN
  Administered 2016-04-06: 5 mg via INTRAVENOUS
  Administered 2016-04-06: 10 mg via INTRAVENOUS

## 2016-04-06 MED ORDER — SUGAMMADEX SODIUM 200 MG/2ML IV SOLN
INTRAVENOUS | Status: DC | PRN
  Administered 2016-04-06: 200 mg via INTRAVENOUS

## 2016-04-06 MED ORDER — ZOLPIDEM TARTRATE 5 MG PO TABS: 5.0000 mg | ORAL_TABLET | Freq: Every evening | ORAL | Status: DC | PRN

## 2016-04-06 MED ORDER — ALUM & MAG HYDROXIDE-SIMETH 200-200-20 MG/5ML PO SUSP
30.0000 mL | ORAL | Status: DC | PRN
  Filled 2016-04-06: qty 30

## 2016-04-06 MED ORDER — HYDROMORPHONE HCL 2 MG PO TABS
2.0000 mg | ORAL_TABLET | ORAL | Status: DC | PRN
  Administered 2016-04-06 – 2016-04-07 (×2): 2 mg via ORAL
  Filled 2016-04-06 (×2): qty 1

## 2016-04-06 MED ORDER — MORPHINE BOLUS VIA INFUSION: 2.0000 mg | INTRAVENOUS | Status: DC | PRN

## 2016-04-06 MED ORDER — ACETAMINOPHEN 10 MG/ML IV SOLN
INTRAVENOUS | Status: AC
  Filled 2016-04-06: qty 100

## 2016-04-06 MED ORDER — FENTANYL CITRATE (PF) 100 MCG/2ML IJ SOLN
25.0000 ug | INTRAMUSCULAR | Status: DC | PRN
  Administered 2016-04-06 (×4): 25 ug via INTRAVENOUS

## 2016-04-06 MED ORDER — VERAPAMIL HCL ER 180 MG PO TBCR
180.0000 mg | EXTENDED_RELEASE_TABLET | ORAL | Status: DC
Start: 2016-04-07 — End: 2016-04-09
  Administered 2016-04-07 – 2016-04-09 (×3): 180 mg via ORAL
  Filled 2016-04-06 (×3): qty 1

## 2016-04-06 MED ORDER — ROCURONIUM BROMIDE 100 MG/10ML IV SOLN
INTRAVENOUS | Status: DC | PRN
  Administered 2016-04-06: 10 mg via INTRAVENOUS
  Administered 2016-04-06: 40 mg via INTRAVENOUS
  Administered 2016-04-06: 10 mg via INTRAVENOUS

## 2016-04-06 MED ORDER — HYDROMORPHONE HCL 2 MG PO TABS
4.0000 mg | ORAL_TABLET | ORAL | Status: DC | PRN
  Administered 2016-04-07: 4 mg via ORAL
  Filled 2016-04-06: qty 2

## 2016-04-06 MED ORDER — SIMETHICONE 80 MG PO CHEW
80.0000 mg | CHEWABLE_TABLET | Freq: Four times a day (QID) | ORAL | Status: DC | PRN
  Administered 2016-04-06 – 2016-04-08 (×8): 80 mg via ORAL
  Filled 2016-04-06 (×8): qty 1

## 2016-04-06 MED ORDER — MIDAZOLAM HCL 2 MG/2ML IJ SOLN
INTRAMUSCULAR | Status: DC | PRN
Start: 2016-04-06 — End: 2016-04-06
  Administered 2016-04-06: 2 mg via INTRAVENOUS

## 2016-04-06 MED ORDER — MORPHINE SULFATE (PF) 2 MG/ML IV SOLN
2.0000 mg | INTRAVENOUS | Status: DC | PRN
  Administered 2016-04-06 – 2016-04-07 (×4): 2 mg via INTRAVENOUS
  Filled 2016-04-06 (×4): qty 1

## 2016-04-06 MED ORDER — MAGNESIUM CITRATE PO SOLN
1.0000 | Freq: Once | ORAL | Status: DC | PRN
  Filled 2016-04-06: qty 296

## 2016-04-06 MED ORDER — LIDOCAINE HCL (CARDIAC) 20 MG/ML IV SOLN
INTRAVENOUS | Status: DC | PRN
  Administered 2016-04-06 (×2): 100 mg via INTRAVENOUS

## 2016-04-06 MED ORDER — CITALOPRAM HYDROBROMIDE 20 MG PO TABS
20.0000 mg | ORAL_TABLET | Freq: Every day | ORAL | Status: DC
  Administered 2016-04-07 – 2016-04-09 (×3): 20 mg via ORAL
  Filled 2016-04-06 (×3): qty 1

## 2016-04-06 SURGICAL SUPPLY — 33 items
BAG COUNTER SPONGE EZ (MISCELLANEOUS) IMPLANT
CANISTER SUCT 1200ML W/VALVE (MISCELLANEOUS) ×2 IMPLANT
CATH TRAY 16F METER LATEX (MISCELLANEOUS) ×2 IMPLANT
CHLORAPREP W/TINT 26ML (MISCELLANEOUS) ×2 IMPLANT
DRAPE LAPAROTOMY 100X77 ABD (DRAPES) ×2 IMPLANT
DRAPE LAPAROTOMY TRNSV 106X77 (MISCELLANEOUS) ×2 IMPLANT
DRSG TELFA 3X8 NADH (GAUZE/BANDAGES/DRESSINGS) ×2 IMPLANT
ELECT BLADE 6 FLAT ULTRCLN (ELECTRODE) ×2 IMPLANT
ELECT REM PT RETURN 9FT ADLT (ELECTROSURGICAL) ×2
ELECTRODE REM PT RTRN 9FT ADLT (ELECTROSURGICAL) ×1 IMPLANT
GAUZE SPONGE 4X4 12PLY STRL (GAUZE/BANDAGES/DRESSINGS) ×2 IMPLANT
GLOVE BIO SURGEON STRL SZ 6 (GLOVE) ×2 IMPLANT
GLOVE BIOGEL PI IND STRL 6.5 (GLOVE) ×1 IMPLANT
GLOVE BIOGEL PI INDICATOR 6.5 (GLOVE) ×1
GOWN STRL REUS W/ TWL LRG LVL3 (GOWN DISPOSABLE) ×2 IMPLANT
GOWN STRL REUS W/TWL LRG LVL3 (GOWN DISPOSABLE) ×2
KIT RM TURNOVER CYSTO AR (KITS) ×2 IMPLANT
LABEL OR SOLS (LABEL) ×2 IMPLANT
LIGASURE IMPACT 36 18CM CVD LR (INSTRUMENTS) ×2 IMPLANT
NS IRRIG 1000ML POUR BTL (IV SOLUTION) ×4 IMPLANT
PACK BASIN MAJOR ARMC (MISCELLANEOUS) ×2 IMPLANT
RETRACTOR WND ALEXIS-O 25 LRG (MISCELLANEOUS) ×1 IMPLANT
RTRCTR WOUND ALEXIS O 25CM LRG (MISCELLANEOUS) ×2
SPONGE LAP 18X18 5 PK (GAUZE/BANDAGES/DRESSINGS) ×4 IMPLANT
STAPLER SKIN PROX 35W (STAPLE) ×2 IMPLANT
SUT MNCRL 3 0 RB1 (SUTURE) ×1 IMPLANT
SUT MONOCRYL 3 0 RB1 (SUTURE) ×1
SUT VIC AB 0 CT1 27 (SUTURE) ×1
SUT VIC AB 0 CT1 27XCR 8 STRN (SUTURE) ×1 IMPLANT
SUT VIC AB 0 CT1 36 (SUTURE) ×2 IMPLANT
SUT VICRYL/POLYSORB 3.0 (SUTURE) ×2 IMPLANT
SYR BULB IRRIG 60ML STRL (SYRINGE) ×2 IMPLANT
TRAY PREP VAG/GEN (MISCELLANEOUS) ×2 IMPLANT

## 2016-04-06 NOTE — Op Note (Addendum)
Procedure(s): HYSTERECTOMY ABDOMINAL WITH BILATERAL SALPINGECTOMY/LEFT OOPHORECTOMY Procedure Note  Natasha Mitchell female 50 y.o. 04/06/2016  Indications: The patient is a 50 y.o. G47P3003 female with symptomatic fibroid uterus, perimenopausal abnormal bleeding, h/o anemia  Pre-operative Diagnosis: Symptomatic fibroid uterus, perimenopausal abnormal bleeding, h/o anemia  Post-operative Diagnosis: Same  Surgeon: Rubie Maid, MD  Assistants: Surgical Scrub Tech  Anesthesia: General endotracheal anesthesia  Findings: The uterus was approximately 14-16 week size on exam under anesthesia.  Multiple fibroids present on uterus. Normal appearing cervix.  Fallopian tubes and ovaries appeared normal.  Procedure Details: The patient was seen in the Holding Room. The risks, benefits, complications, treatment options, and expected outcomes were discussed with the patient.  The patient concurred with the proposed plan, giving informed consent.  The site of surgery properly noted/marked. The patient was taken to the Operating Room, identified as Natasha Mitchell and the procedure verified as Procedure(s) (LRB): HYSTERECTOMY ABDOMINAL WITH BILATERAL SALPINGECTOMY. A Time Out was held and the above information confirmed.  After induction of anesthesia, the patient was placed in supine position.  She was prepped and draped in the usual sterile manner.after anesthesia and draped and prepped in the usual sterile manner. Foley catheter was placed.  A Pfannenstiel incision was made and carried through the subcutaneous tissue to the fascia. Fascial incision was made and extended laterally. The rectus muscles were separated. The peritoneum was identified and entered. Peritoneal incision was extended longitudinally.  The above findings were noted. An Alexis retractor was placed and bowel was packed away from the surgical site.   The round ligaments were identified, ligated and cut with the Ligasure  device. The anterior peritoneal reflection was incised and the bladder was dissected off the lower uterine segment.  The right utero-ovarian ligament and proximal fallopian tube were grasped, cut, and ligated with the Ligasure device. The left utero-ovarian ligament and proximal fallopian tube were grasped, cut and suture ligated with 0-Vicryl. Hemostasis was observed. The uterine vessels were skeletonized, then clamped, ligated and cut with the Ligasure device. Serial pedicles of the cardinal and utero-sacral ligaments were clamped, ligated , and cut with the Ligasure device. Entrance was made into the vagina and the uterus removed. Vaginal cuff angle sutures were placed incorporating the utero-sacral ligaments for support. The vaginal cuff was then closed with a running stitch of 0-Vicryl. Lavage was carried out until clear. Hemostasis was observed.  Attention was returned to the adnexa.  The right fallopian tube was grasped and tented up.  The Ligasure device was used to transect the fallopian tube at the mesosalpinx.  Good hemostasis was noted.  A similar procedure was carried out on the the left. Bleeding was noted from the infundibulopelvic ligament and the ovary.  Unable to control bleeding with cautery so the decision was made to transect the left infundibulopelvic ligament with removal of the ovary. Good hemostasis was noted after this.    Retractor and all packing was removed from the abdomen. The fascia was approximated with a running suture of 0-Vicryl. Lavage was again carried out. Hemostasis was observed. The subcutaneous tissue layer was approximated with 3-0 Vicryl. The skin was approximated with 4-0 Monocryl.  Instrument, sponge, and needle counts were correct prior to abdominal closure and at the conclusion of the case.      Estimated Blood Loss:  75 ml      Drains: straight catheterization prior to procedure with 425 ml of clear urine         Total IV  Fluids:  1400  ml  Specimens:  Uterus with cervix, bilateral fallopian tubes, left ovary         Implants: None         Complications:  None; patient tolerated the procedure well.         Disposition: PACU - hemodynamically stable.         Condition: stable   Rubie Maid, MD [facility]

## 2016-04-06 NOTE — H&P (Signed)
UPDATE TO PREVIOUS HISTORY AND PHYSICAL  The patient has been seen and examined.  H&P is up to date, no changes noted.  Patient is a 50 y.o. G61P3003 female scheduled for total abdominal hysterectomy with bilateral salpingectomy. Indications for procedure are menometrorrhagia (abnormal perimenopausal bleeding), h/o mild anemia, fibroid uterus.  Patient can proceed to the OR for scheduled procedure.   Rubie Maid, MD 04/06/2016 7:19 AM

## 2016-04-06 NOTE — Anesthesia Preprocedure Evaluation (Addendum)
Anesthesia Evaluation  Patient identified by MRN, date of birth, ID band Patient awake    Reviewed: Allergy & Precautions, NPO status , Patient's Chart, lab work & pertinent test results  Airway Mallampati: I  TM Distance: >3 FB     Dental  (+) Partial Upper   Pulmonary former smoker,    Pulmonary exam normal        Cardiovascular hypertension, Pt. on medications Normal cardiovascular exam     Neuro/Psych Anxiety Depression negative neurological ROS     GI/Hepatic negative GI ROS, Neg liver ROS,   Endo/Other  negative endocrine ROS  Renal/GU negative Renal ROS  Female GU complaint     Musculoskeletal negative musculoskeletal ROS (+)   Abdominal Normal abdominal exam  (+)   Peds negative pediatric ROS (+)  Hematology  (+) anemia ,   Anesthesia Other Findings   Reproductive/Obstetrics                            Anesthesia Physical Anesthesia Plan  ASA: II  Anesthesia Plan: General   Post-op Pain Management:    Induction: Intravenous  Airway Management Planned: Oral ETT  Additional Equipment:   Intra-op Plan:   Post-operative Plan: Extubation in OR  Informed Consent: I have reviewed the patients History and Physical, chart, labs and discussed the procedure including the risks, benefits and alternatives for the proposed anesthesia with the patient or authorized representative who has indicated his/her understanding and acceptance.   Dental advisory given  Plan Discussed with: CRNA and Surgeon  Anesthesia Plan Comments:         Anesthesia Quick Evaluation

## 2016-04-06 NOTE — Transfer of Care (Signed)
Immediate Anesthesia Transfer of Care Note  Patient: Natasha Mitchell  Procedure(s) Performed: Procedure(s): HYSTERECTOMY ABDOMINAL WITH BILATERAL SALPINGECTOMY (Bilateral)  Patient Location: PACU  Anesthesia Type:General  Level of Consciousness: awake, alert  and oriented  Airway & Oxygen Therapy: Patient connected to face mask oxygen  Post-op Assessment: Post -op Vital signs reviewed and stable  Post vital signs: stable  Last Vitals:  Filed Vitals:   04/06/16 0616 04/06/16 0930  BP: 157/84 102/59  Pulse: 81 78  Temp: 36.3 C 36.2 C  Resp: 16 16    Last Pain: There were no vitals filed for this visit.       Complications: No apparent anesthesia complications

## 2016-04-06 NOTE — Anesthesia Procedure Notes (Signed)
Procedure Name: Intubation Date/Time: 04/06/2016 7:42 AM Performed by: Aline Brochure Pre-anesthesia Checklist: Patient identified, Emergency Drugs available, Suction available and Patient being monitored Patient Re-evaluated:Patient Re-evaluated prior to inductionOxygen Delivery Method: Circle system utilized Preoxygenation: Pre-oxygenation with 100% oxygen Intubation Type: IV induction Ventilation: Mask ventilation without difficulty Laryngoscope Size: Mac and 3 Grade View: Grade II Tube type: Oral Tube size: 7.0 mm Number of attempts: 1 Airway Equipment and Method: Stylet Secured at: 22 cm Tube secured with: Tape Dental Injury: Teeth and Oropharynx as per pre-operative assessment

## 2016-04-07 LAB — BASIC METABOLIC PANEL
Anion gap: 5 (ref 5–15)
BUN: 6 mg/dL (ref 6–20)
CALCIUM: 8.3 mg/dL — AB (ref 8.9–10.3)
CO2: 29 mmol/L (ref 22–32)
Chloride: 103 mmol/L (ref 101–111)
Creatinine, Ser: 0.51 mg/dL (ref 0.44–1.00)
GFR calc Af Amer: >60 mL/min (ref >60)
GFR calc non Af Amer: >60 mL/min (ref >60)
GLUCOSE: 145 mg/dL — AB (ref 65–99)
Potassium: 3.5 mmol/L (ref 3.5–5.1)
Sodium: 137 mmol/L (ref 135–145)

## 2016-04-07 LAB — CBC
HCT: 32.2 % — ABNORMAL LOW (ref 35.0–47.0)
Hemoglobin: 10.1 g/dL — ABNORMAL LOW (ref 12.0–16.0)
MCH: 22.1 pg — AB (ref 26.0–34.0)
MCHC: 31.5 g/dL — AB (ref 32.0–36.0)
MCV: 70.2 fL — AB (ref 80.0–100.0)
PLATELETS: 347 10*3/uL (ref 150–440)
RBC: 4.58 MIL/uL (ref 3.80–5.20)
RDW: 18.4 % — AB (ref 11.5–14.5)
WBC: 13.2 10*3/uL — ABNORMAL HIGH (ref 3.6–11.0)

## 2016-04-07 MED ORDER — HYDROCODONE-ACETAMINOPHEN 5-325 MG PO TABS
1.0000 | ORAL_TABLET | ORAL | Status: DC | PRN
  Administered 2016-04-07 – 2016-04-09 (×9): 1 via ORAL
  Filled 2016-04-07: qty 1
  Filled 2016-04-07: qty 2
  Filled 2016-04-07 (×5): qty 1
  Filled 2016-04-07 (×2): qty 2

## 2016-04-07 NOTE — Progress Notes (Signed)
1 Day Post-Op Procedure(s) (LRB): HYSTERECTOMY ABDOMINAL WITH BILATERAL SALPINGECTOMY/LEFT OOPHORECTOMY  Subjective: Patient denies vomiting but does note nausea with pain meds.  She is tolerating PO, and reports + flatus, incisional pain, and no problems voiding.    Objective: I have reviewed patient's vital signs, intake and output and labs.  Temp:  [97.4 F (36.3 C)-98.3 F (36.8 C)] 98.3 F (36.8 C) (07/04 0726) Pulse Rate:  [62-81] 73 (07/04 0726) Resp:  [11-20] 18 (07/04 0726) BP: (100-147)/(50-77) 132/72 mmHg (07/04 0726) SpO2:  [96 %-100 %] 100 % (07/04 0726)  General: alert and no distress Resp: clear to auscultation bilaterally Cardio: regular rate and rhythm, S1, S2 normal, no murmur, click, rub or gallop GI: soft, non-tender; bowel sounds normal; no masses,  no organomegaly. Incision: dressing clean/dry/intact.  Extremities: extremities normal, atraumatic, no cyanosis or edema Vaginal Bleeding: none   Labs:  CBC Latest Ref Rng 04/07/2016 04/01/2016 01/21/2016  WBC 3.6 - 11.0 K/uL 13.2(H) 6.2 5.4  Hemoglobin 12.0 - 16.0 g/dL 10.1(L) 11.6(L) -  Hematocrit 35.0 - 47.0 % 32.2(L) 36.0 34.9  Platelets 150 - 440 K/uL 347 362 531(H)    Lab Results  Component Value Date   CREATININE 0.51 04/07/2016   CREATININE 0.79 04/01/2016    Assessment: s/p Procedure(s): HYSTERECTOMY ABDOMINAL WITH BILATERAL SALPINGECTOMY (Bilateral)/LEFT OOPHORECTOMY: stable, progressing well and tolerating diet  Plan: Advance diet Encourage ambulation Continue PO medication.  Will change from Dilaudid to Norco as patient reports nausea and dizziness with medication.  Discontinue IV fluids  Foley catheter discontinued. Continue routine post-op care    LOS: 1 day    Rubie Maid 04/07/2016, 11:06 AM

## 2016-04-07 NOTE — Discharge Instructions (Signed)
General Gynecological Post-Operative Instructions °You may expect to feel dizzy, weak, and drowsy for as long as 24 hours after receiving the medicine that made you sleep (anesthetic).  °Do not drive a car, ride a bicycle, participate in physical activities, or take public transportation until you are done taking narcotic pain medicines or as directed by your doctor.  °Do not drink alcohol or take tranquilizers.  °Do not take medicine that has not been prescribed by your doctor.  °Do not sign important papers or make important decisions while on narcotic pain medicines.  °Have a responsible person with you.  °CARE OF INCISION  °Keep incision clean and dry. °Take showers instead of baths until your doctor gives you permission to take baths.  °Avoid heavy lifting (more than 10 pounds/4.5 kilograms), pushing, or pulling.  °Avoid activities that may risk injury to your surgical site.  °No sexual intercourse or placement of anything in the vagina for 6 weeks or as instructed by your doctor. °If you have tubes coming from the wound site, check with your doctor regarding appropriate care of the tubes. °Only take prescription or over-the-counter medicines  for pain, discomfort, or fever as directed by your doctor. Do not take aspirin. It can make you bleed. Take medicines (antibiotics) that kill germs if they are prescribed for you.  °Call the office or go to the MAU if:  °You feel sick to your stomach (nauseous).  °You start to throw up (vomit).  °You have trouble eating or drinking.  °You have an oral temperature above 101.  °You have constipation that is not helped by adjusting diet or increasing fluid intake. Pain medicines are a common cause of constipation.  °You have any other concerns. °SEEK IMMEDIATE MEDICAL CARE IF:  °You have persistent dizziness.  °You have difficulty breathing or a congested sounding (croupy) cough.  °You have an oral temperature above 102.5, not controlled by medicine.  °There is increasing  pain or tenderness near or in the surgical site.  ° °

## 2016-04-08 MED ORDER — LIDOCAINE 5 % EX PTCH
1.0000 | MEDICATED_PATCH | CUTANEOUS | Status: DC
  Administered 2016-04-08: 1 via TRANSDERMAL
  Filled 2016-04-08: qty 1

## 2016-04-08 NOTE — Anesthesia Postprocedure Evaluation (Signed)
Anesthesia Post Note  Patient: Tayla Fresco Gordin  Procedure(s) Performed: Procedure(s) (LRB): HYSTERECTOMY ABDOMINAL WITH BILATERAL SALPINGECTOMY (Bilateral)  Patient location during evaluation: PACU Anesthesia Type: General Level of consciousness: awake and alert and oriented Pain management: pain level controlled Vital Signs Assessment: post-procedure vital signs reviewed and stable Respiratory status: spontaneous breathing Cardiovascular status: blood pressure returned to baseline Anesthetic complications: no    Last Vitals:  Filed Vitals:   04/08/16 0418 04/08/16 0713  BP: 102/48 122/64  Pulse: 67 64  Temp: 36.7 C 36.4 C  Resp: 16 18    Last Pain:  Filed Vitals:   04/08/16 0713  PainSc: Asleep                 Muskan Bolla

## 2016-04-08 NOTE — Progress Notes (Signed)
2 Days Post-Op Procedure(s) (LRB): HYSTERECTOMY ABDOMINAL WITH BILATERAL SALPINGECTOMY/LEFT OOPHORECTOMY  Subjective: Patient denies doing better today.  Denies nausea/vomiting. She is tolerating PO, and reports + flatus. Denies incisional pain and no problems voiding.    Objective: I have reviewed patient's vital signs, intake and output and labs.  Blood pressure 122/64, pulse 64, temperature 97.6 F (36.4 C), temperature source Oral, resp. rate 18, last menstrual period 02/29/2016, SpO2 100 %.  General: alert and no distress Resp: clear to auscultation bilaterally Cardio: regular rate and rhythm, S1, S2 normal, no murmur, click, rub or gallop GI: soft, non-tender; bowel sounds normal; no masses,  no organomegaly. Incision: incision healing well, clean/dry/intact with steri-strips.  Extremities: extremities normal, atraumatic, no cyanosis or edema Vaginal Bleeding: none   Labs:  CBC Latest Ref Rng 04/07/2016 04/01/2016 01/21/2016  WBC 3.6 - 11.0 K/uL 13.2(H) 6.2 5.4  Hemoglobin 12.0 - 16.0 g/dL 10.1(L) 11.6(L) -  Hematocrit 35.0 - 47.0 % 32.2(L) 36.0 34.9  Platelets 150 - 440 K/uL 347 362 531(H)    Lab Results  Component Value Date   CREATININE 0.51 04/07/2016   CREATININE 0.79 04/01/2016    Assessment: s/p Procedure(s): HYSTERECTOMY ABDOMINAL WITH BILATERAL SALPINGECTOMY (Bilateral)/LEFT OOPHORECTOMY: stable, progressing well and tolerating diet  Plan: Regular diet, as tolerated Encourage ambulation Continue PO medication.  Doing better on Norco.  Continue routine post-op care. Dispo: likely d/c home in a.m.       LOS: 2 days    Rubie Maid 04/08/2016, 8:04 AM

## 2016-04-09 LAB — SURGICAL PATHOLOGY

## 2016-04-09 MED ORDER — ACETAMINOPHEN 325 MG PO TABS: 650.0000 mg | ORAL_TABLET | Freq: Four times a day (QID) | ORAL | Status: DC | PRN

## 2016-04-09 MED ORDER — DOCUSATE SODIUM 100 MG PO CAPS: 100.0000 mg | ORAL_CAPSULE | Freq: Two times a day (BID) | ORAL | Status: DC | PRN

## 2016-04-09 MED ORDER — HYDROCODONE-ACETAMINOPHEN 5-325 MG PO TABS: 1.0000 | ORAL_TABLET | Freq: Four times a day (QID) | ORAL | Status: DC | PRN

## 2016-04-09 MED ORDER — SIMETHICONE 80 MG PO CHEW: 80.0000 mg | CHEWABLE_TABLET | Freq: Four times a day (QID) | ORAL | Status: DC | PRN

## 2016-04-09 NOTE — Progress Notes (Signed)
Patient discharged home. Vital signs stable, bleeding within normal limits, incision clean and dry. Discharge instructions, prescriptions, and follow up appointment given to and reviewed with patient. Patient verbalized understanding, all questions answered. Escorted in wheelchair by auxiliary.

## 2016-04-09 NOTE — Discharge Summary (Signed)
Gynecology Physician Postoperative Discharge Summary  Patient ID: Natasha Mitchell MRN: [id number] DOB/AGE: Mar 12, 1966 50 y.o.  Admit Date: 04/06/2016 Discharge Date: 04/09/2016  Preoperative Diagnoses: Symptomatic uterine fibroids,  Perimenopausal abnormal bleeding, h/o anemia  Procedures: Procedure(s) (LRB): HYSTERECTOMY ABDOMINAL WITH BILATERAL SALPINGECTOMY (Bilateral)/LEFT OOPHORECTOMY  Hospital Course:  Natasha Mitchell is a 50 y.o. [id number] admitted for scheduled surgery.  She underwent the procedures as mentioned above, her operation was uncomplicated. For further details about surgery, please refer to the operative report. Patient had an uncomplicated postoperative course. By time of discharge on POD#3, her pain was controlled on oral pain medications; she was ambulating, voiding without difficulty, tolerating regular diet and passing flatus. She was deemed stable for discharge to home.   Significant Labs: CBC Latest Ref Rng 04/07/2016 04/01/2016 01/21/2016  WBC 3.6 - 11.0 K/uL 13.2(H) 6.2 5.4  Hemoglobin 12.0 - 16.0 g/dL 10.1(L) 11.6(L) -  Hematocrit 35.0 - 47.0 % 32.2(L) 36.0 34.9  Platelets 150 - 440 K/uL 347 362 531(H)    Discharge Exam: Blood pressure 142/62, pulse 80, temperature 98.9 F (37.2 C), temperature source Oral, resp. rate 18, last menstrual period 02/29/2016, SpO2 100 %. General appearance: alert and no distress  Resp: clear to auscultation bilaterally  Cardio: regular rate and rhythm  GI: soft, non-tender; bowel sounds normal; no masses, no organomegaly.  Incision: C/D/I, no erythema, no drainage noted Pelvic: scant blood on pad  Extremities: extremities normal, atraumatic, no cyanosis or edema and Homans sign is negative, no sign of DVT  Discharged Condition: Stable  Disposition: 01-Home or Self Care     Medication List    TAKE these medications        Biotin 5000 MCG Caps  Take 1 capsule by mouth daily.     citalopram 20 MG tablet  Commonly  known as:  CELEXA  Take 1 tablet (20 mg total) by mouth daily.     docusate sodium 100 MG capsule  Commonly known as:  COLACE  Take 1 capsule (100 mg total) by mouth 2 (two) times daily as needed for mild constipation.     FISH OIL PO  Take 1,000 mg by mouth daily. In am     hydrochlorothiazide 25 MG tablet  Commonly known as:  HYDRODIURIL  Take 1 tablet (25 mg total) by mouth daily.     HYDROcodone-acetaminophen 5-325 MG tablet  Commonly known as:  NORCO/VICODIN  Take 1-2 tablets by mouth every 6 (six) hours as needed for moderate pain.     iron polysaccharides 150 MG capsule  Commonly known as:  NIFEREX  Take 1 capsule (150 mg total) by mouth daily.     multivitamin tablet  Take 1 tablet by mouth daily. In am     OVER THE COUNTER MEDICATION  Take 1 tablet by mouth daily. blackseed     polyethylene glycol powder powder  Commonly known as:  GLYCOLAX/MIRALAX  Take 1 Container by mouth daily. 1 scoop per day     simethicone 80 MG chewable tablet  Commonly known as:  MYLICON  Chew 1 tablet (80 mg total) by mouth 4 (four) times daily as needed for flatulence.     verapamil 180 MG (CO) 24 hr tablet  Commonly known as:  COVERA HS  Take 1 tablet (180 mg total) by mouth at bedtime.     VITAMIN B-12 PO  Take 1 tablet by mouth daily. In am     Vitamin D3 1000 units Caps  Take 1 capsule by  mouth daily.     vitamin E 400 UNIT capsule  Take 400 Units by mouth daily. In am.           Follow-up Information    Follow up with Rubie Maid, MD In 1 week.   Specialties:  Obstetrics and Gynecology, Radiology   Why:  For wound re-check, and 6 weeks for post-op visit   Contact information:   [place] [id number] [phone]       Signed:  Rubie Maid, MD [facility] 04/09/2016 11:40 AM

## 2016-04-09 NOTE — Progress Notes (Signed)
3 Days Post-Op Procedure(s) (LRB): HYSTERECTOMY ABDOMINAL WITH BILATERAL SALPINGECTOMY/LEFT OOPHORECTOMY  Subjective: Patient denies complaints.  Denies nausea/vomiting. She is tolerating PO, and reports + flatus. Denies incisional pain and no problems voiding.    Objective: I have reviewed patient's vital signs, intake and output and labs.  Blood pressure 142/62, pulse 80, temperature 98.9 F (37.2 C), temperature source Oral, resp. rate 18, last menstrual period 02/29/2016, SpO2 100 %.  General: alert and no distress Resp: clear to auscultation bilaterally Cardio: regular rate and rhythm, S1, S2 normal, no murmur, click, rub or gallop GI: soft, non-tender; bowel sounds normal; no masses,  no organomegaly. Incision: incision healing well, clean/dry/intact with steri-strips.  Extremities: extremities normal, atraumatic, no cyanosis or edema Vaginal Bleeding: none   Labs:  CBC Latest Ref Rng 04/07/2016 04/01/2016 01/21/2016  WBC 3.6 - 11.0 K/uL 13.2(H) 6.2 5.4  Hemoglobin 12.0 - 16.0 g/dL 10.1(L) 11.6(L) -  Hematocrit 35.0 - 47.0 % 32.2(L) 36.0 34.9  Platelets 150 - 440 K/uL 347 362 531(H)    Lab Results  Component Value Date   CREATININE 0.51 04/07/2016   CREATININE 0.79 04/01/2016    Assessment: s/p Procedure(s): HYSTERECTOMY ABDOMINAL WITH BILATERAL SALPINGECTOMY (Bilateral)/LEFT OOPHORECTOMY: stable, progressing well and tolerating diet  Plan: Regular diet, as tolerated Continue to encourage ambulation Continue PO medication.  Dispo: d/c home today.        LOS: 3 days    Rubie Maid 04/09/2016, 11:40 AM

## 2016-04-10 ENCOUNTER — Encounter: Payer: Self-pay | Admitting: Family Medicine

## 2016-04-10 ENCOUNTER — Encounter: Payer: Self-pay | Admitting: Obstetrics and Gynecology

## 2016-04-10 DIAGNOSIS — D509 Iron deficiency anemia, unspecified: Secondary | ICD-10-CM

## 2016-04-10 DIAGNOSIS — R718 Other abnormality of red blood cells: Secondary | ICD-10-CM

## 2016-04-13 NOTE — Telephone Encounter (Signed)
Reviewed pt note; reviewed recent labs; new orders for labs around August 21st entered

## 2016-04-13 NOTE — Assessment & Plan Note (Signed)
Check ferritin level and CBC in 6 weeks

## 2016-04-13 NOTE — Assessment & Plan Note (Signed)
Check labs in 6 weeks

## 2016-04-21 ENCOUNTER — Ambulatory Visit (INDEPENDENT_AMBULATORY_CARE_PROVIDER_SITE_OTHER): Payer: 59 | Admitting: Obstetrics and Gynecology

## 2016-04-21 ENCOUNTER — Encounter: Payer: Self-pay | Admitting: Obstetrics and Gynecology

## 2016-04-21 VITALS — BP 148/72 | HR 79 | Ht 63.0 in | Wt 206.4 lb

## 2016-04-21 DIAGNOSIS — Z9889 Other specified postprocedural states: Secondary | ICD-10-CM

## 2016-04-21 DIAGNOSIS — Z9079 Acquired absence of other genital organ(s): Secondary | ICD-10-CM

## 2016-04-21 DIAGNOSIS — Z90710 Acquired absence of both cervix and uterus: Secondary | ICD-10-CM

## 2016-04-21 DIAGNOSIS — Z90721 Acquired absence of ovaries, unilateral: Secondary | ICD-10-CM

## 2016-04-21 NOTE — Progress Notes (Signed)
   GYNECOLOGY CLINIC POST-OPERATIVE VISIT  Subjective:     Natasha Mitchell is a 50 y.o. G43P3003 female who presents to the clinic 2 weeks status post total abdominal hysterectomy and right oophorectomy and bilateral salpingectomy for abnormal uterine bleeding and fibroids. Eating a regular diet without difficulty. Bowel movements are normal. Pain is controlled with current analgesics. Medications being used: prescription NSAID's including ibuprofen (Motrin) and narcotic analgesics including oxycodone/acetaminophen (Percocet, Tylox).  The following portions of the patient's history were reviewed and updated as appropriate: allergies, current medications, past family history, past medical history, past social history, past surgical history and problem list.  Review of Systems Pertinent items noted in HPI and remainder of comprehensive ROS otherwise negative.    Objective:    BP 148/72 mmHg  Pulse 79  Ht 5\' 3"  (1.6 m)  Wt 206 lb 6.4 oz (93.622 kg)  BMI 36.57 kg/m2  LMP 04/07/2015 General:  alert and no distress  Abdomen: soft, bowel sounds active, non-tender  Incision:   healing well, no drainage, no erythema, no hernia, no seroma, no swelling, no dehiscence, incision well approximated.      Assessment:   Doing well postoperatively. Operative findings again reviewed. Pathology report discussed.    Plan:   1. Continue any current medications. 2. Wound care discussed. 3. Activity restrictions: no bending, stooping, or squatting and no lifting more than 15 pounds 4. Anticipated return to work: 4 weeks. 5. Follow up: 4 weeks for final post-op check.     Rubie Maid, MD [facility]

## 2016-04-26 HISTORY — PX: ABDOMINAL HYSTERECTOMY: SHX81

## 2016-04-29 ENCOUNTER — Encounter: Payer: Self-pay | Admitting: Family Medicine

## 2016-05-11 ENCOUNTER — Encounter: Payer: 59 | Attending: Family Medicine

## 2016-05-11 DIAGNOSIS — R7303 Prediabetes: Secondary | ICD-10-CM | POA: Insufficient documentation

## 2016-05-12 ENCOUNTER — Telehealth: Payer: Self-pay | Admitting: Dietician

## 2016-05-12 NOTE — Telephone Encounter (Signed)
Called patient regarding class missed on 05/11/16. She reports returning late from an out-of-town trip. She will come to class 3 on 05/18/16 and reschedule class 2 at that time.

## 2016-05-16 ENCOUNTER — Encounter: Payer: Self-pay | Admitting: Obstetrics and Gynecology

## 2016-05-18 ENCOUNTER — Encounter: Payer: Self-pay | Admitting: Dietician

## 2016-05-18 ENCOUNTER — Encounter: Payer: 59 | Admitting: Dietician

## 2016-05-18 VITALS — BP 146/90 | Ht 64.0 in | Wt 209.0 lb

## 2016-05-18 DIAGNOSIS — R7303 Prediabetes: Secondary | ICD-10-CM

## 2016-05-18 NOTE — Progress Notes (Signed)

## 2016-05-21 ENCOUNTER — Ambulatory Visit (INDEPENDENT_AMBULATORY_CARE_PROVIDER_SITE_OTHER): Payer: 59 | Admitting: Obstetrics and Gynecology

## 2016-05-21 ENCOUNTER — Encounter: Payer: Self-pay | Admitting: Obstetrics and Gynecology

## 2016-05-21 VITALS — BP 139/75 | HR 73 | Ht 63.0 in | Wt 206.7 lb

## 2016-05-21 DIAGNOSIS — Z90710 Acquired absence of both cervix and uterus: Secondary | ICD-10-CM

## 2016-05-21 DIAGNOSIS — Z8742 Personal history of other diseases of the female genital tract: Secondary | ICD-10-CM

## 2016-05-21 DIAGNOSIS — Z86018 Personal history of other benign neoplasm: Secondary | ICD-10-CM

## 2016-05-21 DIAGNOSIS — Z9889 Other specified postprocedural states: Secondary | ICD-10-CM

## 2016-05-21 NOTE — Progress Notes (Signed)
   GYNECOLOGY CLINIC POST-OPERATIVE VISIT  Subjective:     Natasha Mitchell is a 50 y.o. G40P3003 female who presents to the clinic 6 weeks status post total abdominal hysterectomy and right oophorectomy and bilateral salpingectomy for abnormal uterine bleeding and fibroids. Eating a regular diet without difficulty. Bowel movements are normal. The patient is not having any pain.  The following portions of the patient's history were reviewed and updated as appropriate: allergies, current medications, past family history, past medical history, past social history, past surgical history and problem list.  Review of Systems Pertinent items noted in HPI and remainder of comprehensive ROS otherwise negative.    Objective:    BP 139/75   Pulse 73   Ht 5\' 3"  (1.6 m)   Wt 206 lb 11.2 oz (93.8 kg)   LMP 04/07/2015   BMI 36.62 kg/m  General:  alert and no distress  Abdomen: soft, bowel sounds active, non-tender  Incision:    incision well approximated, well healed.   Pelvis:  external genitalia normal, rectovaginal septum normal.  Vagina without discharge. Vaginal cuff well healed.   Cervix and uterus surgically absent. Adnexae non-palpable, nontender bilaterally.      Assessment:   Doing well postoperatively. Operative findings again reviewed. Pathology report discussed.    Plan:   1. Continue any current medications. 2. Wound care discussed. 3. Activity restrictions: none 4. 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 5. Follow up:  As needed   Rubie Maid, MD [facility]

## 2016-06-04 ENCOUNTER — Encounter: Payer: 59 | Admitting: *Deleted

## 2016-06-04 ENCOUNTER — Encounter: Payer: Self-pay | Admitting: *Deleted

## 2016-06-04 VITALS — Wt 209.0 lb

## 2016-06-04 DIAGNOSIS — R7303 Prediabetes: Secondary | ICD-10-CM | POA: Diagnosis not present

## 2016-06-04 NOTE — Progress Notes (Signed)
Appt. Start Time: 0900 Appt. End Time: 1130  Class 2 Diabetes Overview - define DM; state own type of DM; identify functions of pancreas and insulin; define insulin deficiency vs insulin resistance  Psychosocial - identify DM as a source of stress; state the effects of stress on BG control; verbalize appropriate stress management techniques; identify personal stress issues   Nutritional Management - describe effects of food on blood glucose; identify sources of carbohydrate, protein and fat; verbalize the importance of balance meals in controlling blood glucose; identify meals as well balanced or not; estimate servings of carbohydrate from menus; use food labels to identify servings size, content of carbohydrate, fiber, protein, fat, saturated fat and sodium; recognize food sources of fat, saturated fat, trans fat, sodium and verbalize goals for intake; describe healthful appropriate food choices when dining out   Exercise - describe the effects of exercise on blood glucose and importance of regular exercise in controlling diabetes; state a plan for personal exercise; verbalize contraindications for exercise  Medications - state name, dose, timing of currently prescribed medications; describe types of medications available for diabetes  Self-Monitoring - state importance of HBGM and demo procedure accurately; use HBGM results to effectively manage diabetes; identify importance of regular HbA1C testing and goals for results  Acute Complications/Sick Day Guidelines - recognize hyperglycemia and hypoglycemia with causes and effects; identify blood glucose results as high, low or in control; list steps in treating and preventing high and low blood glucose; state appropriate measure to manage blood glucose when ill (need for meds, HBGM plan, when to call physician, need for fluids)  Chronic Complications/Foot, Skin, Eye Dental Care - identify possible long-term complications of diabetes (retinopathy,  neuropathy, nephropathy, cardiovascular disease, infections); explain steps in prevention and treatment of chronic complications; state importance of daily self-foot exams; describe how to examine feet and what to look for; explain appropriate eye and dental care  Lifestyle Changes/Goals & Health/Community Resources - state benefits of making appropriate lifestyle changes; identify habits that need to change (meals, tobacco, alcohol); identify strategies to reduce risk factors for personal health; set goals for proper diabetes care; state need for and frequency of healthcare follow-up; describe appropriate community resources for good health (ADA, web sites, apps)   Pregnancy/Sexual Health - define gestational diabetes; state importance of good blood glucose control and birth control prior to pregnancy; state importance of good blood glucose control in preventing sexual problems (impotence, vaginal dryness, infections, loss of desire); state relationship of blood glucose control and pregnancy outcome; describe risk of maternal and fetal complications  Teaching Materials Used: Class 2 Slide Packet A1C Pamphlet Foot Care Literature Menu Ideas Goals for Class 2  

## 2016-06-05 ENCOUNTER — Encounter: Payer: Self-pay | Admitting: *Deleted

## 2016-07-22 ENCOUNTER — Telehealth: Payer: Self-pay

## 2016-07-22 DIAGNOSIS — I10 Essential (primary) hypertension: Secondary | ICD-10-CM

## 2016-07-22 MED ORDER — VERAPAMIL HCL ER 180 MG PO CP24: 180.0000 mg | ORAL_CAPSULE | Freq: Every day | ORAL | 3 refills | Status: DC

## 2016-07-22 NOTE — Telephone Encounter (Signed)
Covera HS not able to be ordered; changing to equivalent verelan PM

## 2016-07-22 NOTE — Telephone Encounter (Signed)
Dr. Sanda Klein Pt is requesting refill on Verapamil 180 Mg. 90 Day supply.

## 2016-07-22 NOTE — Addendum Note (Signed)
Addended by: LADA, Satira Anis on: 07/22/2016 05:57 PM   Modules accepted: Orders

## 2016-07-23 ENCOUNTER — Encounter: Payer: 59 | Admitting: Family Medicine

## 2016-07-29 ENCOUNTER — Encounter: Payer: Self-pay | Admitting: Family Medicine

## 2016-07-31 MED ORDER — GLUCOSE BLOOD VI STRP: ORAL_STRIP | 1 refills | Status: AC

## 2016-08-01 ENCOUNTER — Encounter: Payer: Self-pay | Admitting: Family Medicine

## 2016-08-03 ENCOUNTER — Ambulatory Visit: Payer: Self-pay | Admitting: Physician Assistant

## 2016-08-03 ENCOUNTER — Encounter: Payer: Self-pay | Admitting: Physician Assistant

## 2016-08-03 VITALS — BP 136/90 | HR 88 | Temp 98.5°F

## 2016-08-03 DIAGNOSIS — R509 Fever, unspecified: Secondary | ICD-10-CM

## 2016-08-03 DIAGNOSIS — J069 Acute upper respiratory infection, unspecified: Secondary | ICD-10-CM

## 2016-08-03 LAB — POCT INFLUENZA A/B
Influenza A, POC: NEGATIVE
Influenza B, POC: NEGATIVE

## 2016-08-03 MED ORDER — HYDROCOD POLST-CPM POLST ER 10-8 MG/5ML PO SUER
5.0000 mL | Freq: Two times a day (BID) | ORAL | 0 refills | Status: DC | PRN
Start: 2016-08-03 — End: 2016-08-10

## 2016-08-03 MED ORDER — FLUCONAZOLE 150 MG PO TABS: 150.0000 mg | ORAL_TABLET | Freq: Once | ORAL | 0 refills | Status: AC

## 2016-08-03 MED ORDER — AZITHROMYCIN 250 MG PO TABS: ORAL_TABLET | ORAL | 0 refills | Status: DC

## 2016-08-03 NOTE — Progress Notes (Signed)
S: C/o runny nose and congestion with dry cough for 2 days, + fever, chills, denies cp/sob, v/d; mucus was green this am but clear throughout the day, cough is sporadic, + body aches and chills, temp was 102 on Saturday and sunday  Using otc meds:   O: PE: vitals wnl, nad,  perrl eomi, normocephalic, tms dull, nasal mucosa red and swollen, throat injected, neck supple no lymph, lungs c t a, cv rrr, neuro intact, flu swab neg  A:  Acute flu like illness,   P: drink fluids, continue regular meds , use otc meds of choice, return if not improving in 5 days, return earlier if worsening , tussionex 168ml 82ml q 12h prn; zpack, diflucan

## 2016-08-10 ENCOUNTER — Encounter: Payer: Self-pay | Admitting: Family Medicine

## 2016-08-10 ENCOUNTER — Ambulatory Visit (INDEPENDENT_AMBULATORY_CARE_PROVIDER_SITE_OTHER): Payer: 59 | Admitting: Family Medicine

## 2016-08-10 VITALS — BP 144/86 | HR 90 | Temp 97.9°F | Resp 14 | Wt 218.0 lb

## 2016-08-10 DIAGNOSIS — Z0000 Encounter for general adult medical examination without abnormal findings: Secondary | ICD-10-CM

## 2016-08-10 DIAGNOSIS — D500 Iron deficiency anemia secondary to blood loss (chronic): Secondary | ICD-10-CM | POA: Diagnosis not present

## 2016-08-10 DIAGNOSIS — Z114 Encounter for screening for human immunodeficiency virus [HIV]: Secondary | ICD-10-CM

## 2016-08-10 DIAGNOSIS — R7301 Impaired fasting glucose: Secondary | ICD-10-CM | POA: Diagnosis not present

## 2016-08-10 DIAGNOSIS — D216 Benign neoplasm of connective and other soft tissue of trunk, unspecified: Secondary | ICD-10-CM

## 2016-08-10 DIAGNOSIS — D367 Benign neoplasm of other specified sites: Secondary | ICD-10-CM

## 2016-08-10 DIAGNOSIS — E8941 Symptomatic postprocedural ovarian failure: Secondary | ICD-10-CM

## 2016-08-10 DIAGNOSIS — H9191 Unspecified hearing loss, right ear: Secondary | ICD-10-CM | POA: Diagnosis not present

## 2016-08-10 LAB — CBC WITH DIFFERENTIAL/PLATELET
BASOS ABS: 0 {cells}/uL (ref 0–200)
Basophils Relative: 0 %
EOS ABS: 192 {cells}/uL (ref 15–500)
EOS PCT: 3 %
HCT: 37.6 % (ref 35.0–45.0)
Hemoglobin: 12 g/dL (ref 11.7–15.5)
LYMPHS PCT: 34 %
Lymphs Abs: 2176 cells/uL (ref 850–3900)
MCH: 23.7 pg — AB (ref 27.0–33.0)
MCHC: 31.9 g/dL — AB (ref 32.0–36.0)
MCV: 74.3 fL — AB (ref 80.0–100.0)
MONOS PCT: 11 %
MPV: 9 fL (ref 7.5–12.5)
Monocytes Absolute: 704 cells/uL (ref 200–950)
NEUTROS PCT: 52 %
Neutro Abs: 3328 cells/uL (ref 1500–7800)
PLATELETS: 430 10*3/uL — AB (ref 140–400)
RBC: 5.06 MIL/uL (ref 3.80–5.10)
RDW: 17.7 % — AB (ref 11.0–15.0)
WBC: 6.4 10*3/uL (ref 3.8–10.8)

## 2016-08-10 NOTE — Assessment & Plan Note (Signed)
Check ferritin 

## 2016-08-10 NOTE — Assessment & Plan Note (Signed)
Check HIV 

## 2016-08-10 NOTE — Assessment & Plan Note (Signed)
Check for wax, in-house screen, refer to audiology if needed

## 2016-08-10 NOTE — Patient Instructions (Addendum)
We'll get labs today We'll have you see the general surgeon about the place on your back If you have not heard anything from my staff in a week about any orders/referrals/studies from today, please contact us here to follow-up [phone] Keep an eye on your blood pressure and let me know if not consistently at goal Your goal blood pressure is less than 140 mmHg on top. Try to follow the DASH guidelines (DASH stands for Dietary Approaches to Stop Hypertension) Try to limit the sodium in your diet.  Ideally, consume less than 1.5 grams (less than 1,513m) per day. Do not add salt when cooking or at the table.  Check the sodium amount on labels when shopping, and choose items lower in sodium when given a choice. Avoid or limit foods that already contain a lot of sodium. Eat a diet rich in fruits and vegetables and whole grains.  Health Maintenance, Female Adopting a healthy lifestyle and getting preventive care can go a long way to promote health and wellness. Talk with your health care provider about what schedule of regular examinations is right for you. This is a good chance for you to check in with your provider about disease prevention and staying healthy. In between checkups, there are plenty of things you can do on your own. Experts have done a lot of research about which lifestyle changes and preventive measures are most likely to keep you healthy. Ask your health care provider for more information. WEIGHT AND DIET  Eat a healthy diet  Be sure to include plenty of vegetables, fruits, low-fat dairy products, and lean protein.  Do not eat a lot of foods high in solid fats, added sugars, or salt.  Get regular exercise. This is one of the most important things you can do for your health.  Most adults should exercise for at least 150 minutes each week. The exercise should increase your heart rate and make you sweat (moderate-intensity exercise).  Most adults should also do strengthening  exercises at least twice a week. This is in addition to the moderate-intensity exercise.  Maintain a healthy weight  Body mass index (BMI) is a measurement that can be used to identify possible weight problems. It estimates body fat based on height and weight. Your health care provider can help determine your BMI and help you achieve or maintain a healthy weight.  For females 259years of age and older:   A BMI below 18.5 is considered underweight.  A BMI of 18.5 to 24.9 is normal.  A BMI of 25 to 29.9 is considered overweight.  A BMI of 30 and above is considered obese.  Watch levels of cholesterol and blood lipids  You should start having your blood tested for lipids and cholesterol at 49years of age, then have this test every 5 years.  You may need to have your cholesterol levels checked more often if:  Your lipid or cholesterol levels are high.  You are older than 49years of age.  You are at high risk for heart disease.  CANCER SCREENING   Lung Cancer  Lung cancer screening is recommended for adults [id number] old who are at high risk for lung cancer because of a history of smoking.  A yearly low-dose CT scan of the lungs is recommended for people who:  Currently smoke.  Have quit within the past 15 years.  Have at least a 30-pack-year history of smoking. A pack year is smoking an average of one  pack of cigarettes a day for 1 year.  Yearly screening should continue until it has been 15 years since you quit.  Yearly screening should stop if you develop a health problem that would prevent you from having lung cancer treatment.  Breast Cancer  Practice breast self-awareness. This means understanding how your breasts normally appear and feel.  It also means doing regular breast self-exams. Let your health care provider know about any changes, no matter how small.  If you are in your 20s or 30s, you should have a clinical breast exam (CBE) by a health care  provider every 1-3 years as part of a regular health exam.  If you are 54 or older, have a CBE every year. Also consider having a breast X-ray (mammogram) every year.  If you have a family history of breast cancer, talk to your health care provider about genetic screening.  If you are at high risk for breast cancer, talk to your health care provider about having an MRI and a mammogram every year.  Breast cancer gene (BRCA) assessment is recommended for women who have family members with BRCA-related cancers. BRCA-related cancers include:  Breast.  Ovarian.  Tubal.  Peritoneal cancers.  Results of the assessment will determine the need for genetic counseling and BRCA1 and BRCA2 testing. Cervical Cancer Your health care provider may recommend that you be screened regularly for cancer of the pelvic organs (ovaries, uterus, and vagina). This screening involves a pelvic examination, including checking for microscopic changes to the surface of your cervix (Pap test). You may be encouraged to have this screening done every 3 years, beginning at age 57.  For women ages 27-65, health care providers may recommend pelvic exams and Pap testing every 3 years, or they may recommend the Pap and pelvic exam, combined with testing for human papilloma virus (HPV), every 5 years. Some types of HPV increase your risk of cervical cancer. Testing for HPV may also be done on women of any age with unclear Pap test results.  Other health care providers may not recommend any screening for nonpregnant women who are considered low risk for pelvic cancer and who do not have symptoms. Ask your health care provider if a screening pelvic exam is right for you.  If you have had past treatment for cervical cancer or a condition that could lead to cancer, you need Pap tests and screening for cancer for at least 20 years after your treatment. If Pap tests have been discontinued, your risk factors (such as having a new sexual  partner) need to be reassessed to determine if screening should resume. Some women have medical problems that increase the chance of getting cervical cancer. In these cases, your health care provider may recommend more frequent screening and Pap tests. Colorectal Cancer  This type of cancer can be detected and often prevented.  Routine colorectal cancer screening usually begins at 49 years of age and continues through 49 years of age.  Your health care provider may recommend screening at an earlier age if you have risk factors for colon cancer.  Your health care provider may also recommend using home test kits to check for hidden blood in the stool.  A small camera at the end of a tube can be used to examine your colon directly (sigmoidoscopy or colonoscopy). This is done to check for the earliest forms of colorectal cancer.  Routine screening usually begins at age 75.  Direct examination of the colon should be repeated every  5-10 years through 49 years of age. However, you may need to be screened more often if early forms of precancerous polyps or small growths are found. Skin Cancer  Check your skin from head to toe regularly.  Tell your health care provider about any new moles or changes in moles, especially if there is a change in a mole's shape or color.  Also tell your health care provider if you have a mole that is larger than the size of a pencil eraser.  Always use sunscreen. Apply sunscreen liberally and repeatedly throughout the day.  Protect yourself by wearing long sleeves, pants, a wide-brimmed hat, and sunglasses whenever you are outside. HEART DISEASE, DIABETES, AND HIGH BLOOD PRESSURE   High blood pressure causes heart disease and increases the risk of stroke. High blood pressure is more likely to develop in:  People who have blood pressure in the high end of the normal range (130-139/85-89 mm Hg).  People who are overweight or obese.  People who are African  American.  If you are 50-81 years of age, have your blood pressure checked every 3-5 years. If you are 78 years of age or older, have your blood pressure checked every year. You should have your blood pressure measured twice--once when you are at a hospital or clinic, and once when you are not at a hospital or clinic. Record the average of the two measurements. To check your blood pressure when you are not at a hospital or clinic, you can use:  An automated blood pressure machine at a pharmacy.  A home blood pressure monitor.  If you are between 71 years and 59 years old, ask your health care provider if you should take aspirin to prevent strokes.  Have regular diabetes screenings. This involves taking a blood sample to check your fasting blood sugar level.  If you are at a normal weight and have a low risk for diabetes, have this test once every three years after 49 years of age.  If you are overweight and have a high risk for diabetes, consider being tested at a younger age or more often. PREVENTING INFECTION  Hepatitis B  If you have a higher risk for hepatitis B, you should be screened for this virus. You are considered at high risk for hepatitis B if:  You were born in a country where hepatitis B is common. Ask your health care provider which countries are considered high risk.  Your parents were born in a high-risk country, and you have not been immunized against hepatitis B (hepatitis B vaccine).  You have HIV or AIDS.  You use needles to inject street drugs.  You live with someone who has hepatitis B.  You have had sex with someone who has hepatitis B.  You get hemodialysis treatment.  You take certain medicines for conditions, including cancer, organ transplantation, and autoimmune conditions. Hepatitis C  Blood testing is recommended for:  Everyone born from [birth year] through 1964.  Anyone with known risk factors for hepatitis C. Sexually transmitted infections  (STIs)  You should be screened for sexually transmitted infections (STIs) including gonorrhea and chlamydia if:  You are sexually active and are younger than 49 years of age.  You are older than 49 years of age and your health care provider tells you that you are at risk for this type of infection.  Your sexual activity has changed since you were last screened and you are at an increased risk for chlamydia or gonorrhea. Ask  your health care provider if you are at risk.  If you do not have HIV, but are at risk, it may be recommended that you take a prescription medicine daily to prevent HIV infection. This is called pre-exposure prophylaxis (PrEP). You are considered at risk if:  You are sexually active and do not regularly use condoms or know the HIV status of your partner(s).  You take drugs by injection.  You are sexually active with a partner who has HIV. Talk with your health care provider about whether you are at high risk of being infected with HIV. If you choose to begin PrEP, you should first be tested for HIV. You should then be tested every 3 months for as long as you are taking PrEP.  PREGNANCY   If you are premenopausal and you may become pregnant, ask your health care provider about preconception counseling.  If you may become pregnant, take 400 to 800 micrograms (mcg) of folic acid every day.  If you want to prevent pregnancy, talk to your health care provider about birth control (contraception). OSTEOPOROSIS AND MENOPAUSE   Osteoporosis is a disease in which the bones lose minerals and strength with aging. This can result in serious bone fractures. Your risk for osteoporosis can be identified using a bone density scan.  If you are 28 years of age or older, or if you are at risk for osteoporosis and fractures, ask your health care provider if you should be screened.  Ask your health care provider whether you should take a calcium or vitamin D supplement to lower your risk  for osteoporosis.  Menopause may have certain physical symptoms and risks.  Hormone replacement therapy may reduce some of these symptoms and risks. Talk to your health care provider about whether hormone replacement therapy is right for you.  HOME CARE INSTRUCTIONS   Schedule regular health, dental, and eye exams.  Stay current with your immunizations.   Do not use any tobacco products including cigarettes, chewing tobacco, or electronic cigarettes.  If you are pregnant, do not drink alcohol.  If you are breastfeeding, limit how much and how often you drink alcohol.  Limit alcohol intake to no more than 1 drink per day for nonpregnant women. One drink equals 12 ounces of beer, 5 ounces of wine, or 1 ounces of hard liquor.  Do not use street drugs.  Do not share needles.  Ask your health care provider for help if you need support or information about quitting drugs.  Tell your health care provider if you often feel depressed.  Tell your health care provider if you have ever been abused or do not feel safe at home.   This information is not intended to replace advice given to you by your health care provider. Make sure you discuss any questions you have with your health care provider.   Document Released: 04/06/2011 Document Revised: 10/12/2014 Document Reviewed: 08/23/2013 Elsevier Interactive Patient Education [employer].

## 2016-08-10 NOTE — Assessment & Plan Note (Signed)
Check A1c. 

## 2016-08-10 NOTE — Assessment & Plan Note (Signed)
USPSTF grade A and B recommendations reviewed with patient; age-appropriate recommendations, preventive care, screening tests, etc discussed and encouraged; healthy living encouraged; see AVS for patient education given to patient  

## 2016-08-10 NOTE — Progress Notes (Signed)
BP (!) 144/86   Pulse 90   Temp 97.9 F (36.6 C) (Oral)   Resp 14   Wt 218 lb (98.9 kg)   LMP 04/07/2015   SpO2 97%   BMI 38.62 kg/m    Subjective:    Patient ID: Natasha Mitchell, female    DOB: [birth year], 50 y.o.   MRN: [id number]  HPI: [place] is a 50 y.o. female  Chief Complaint  Patient presents with  . Annual Exam   She has hysterectomy and RIGHT oophorectomy and bilateral salpingectomy; LEFT ovary remains but having hot flashes; no pelvic pain, no fever except for a flu-like illness a week ago which has resolved; zpak from employee health; coughing better, going for recheck tomorrow and they'll do more testing; not specifically addressed today  Flu shot UTD, Sept  USPSTF grade A and B recommendations Alcohol: maybe 5 drinks a week Depression:  Depression screen [facility] 2/9 08/10/2016 02/18/2016 01/21/2016 06/18/2015  Decreased Interest 0 0 0 0  Down, Depressed, Hopeless 0 0 0 1  PHQ - 2 Score 0 0 0 1   Hypertension: she has not taken her medicine today; was taking coricidin; just coughing ridiculous, seeing Joann tomorrow Obesity: weight gain related to the surgery; she was 205 pounds at home; like fluid overload, skin just feels different; no thyroid trouble in the family Tobacco use: nonsmoker, quit 2001 HIV, hep B, hep C: checks regularly, low risk STD testing and prevention (chl/gon/syphilis): declined Lipids: today Glucose: a1c was 5.9 more than 6 months ago Colorectal cancer: first cousins had colon cancer; colonoscopy May 2018, found five polyps, Dr. Gustavo Lah; goes back to see him tomorrow Breast cancer: March 3017 BRCA gene screening: no ovarian cancer Intimate partner violence: no Cervical cancer screening: s/p hysterectomy, sees GYN; done for uterine fibroids and uterine bleeding; LEFT ovary remains Lung cancer: n/a Osteoporosis: no steroids Fall prevention/vitamin D: taking vit D, 1000 iu daily Diet: pretty good eater, will back off of  some stuff Exercise: not as active after her surgery; maybe 2 days a week; 12 hour shifts; needs to rest more after surgery; no pain after surgery Skin cancer: twomoles, one on back and one on left calf; checked many years ago; not changing; not itching or bleeding  Eight years of right ear hearing decreased  Depression screen [facility] 2/9 08/10/2016 02/18/2016 01/21/2016 06/18/2015  Decreased Interest 0 0 0 0  Down, Depressed, Hopeless 0 0 0 1  PHQ - 2 Score 0 0 0 1   Relevant past medical, surgical, family and social history reviewed Past Medical History:  Diagnosis Date  . Anxiety   . Depression   . Family hx of colon cancer 02/15/2016  . History of uterine fibroid    s/p abdominal hysterectomy with LSO and bilateral salpingectomy 03/2016  . Hypertension   . Intermittent palpitations   . Prediabetes   . Vein, varicose 2016   Past Surgical History:  Procedure Laterality Date  . ABLATION SAPHENOUS VEIN W/ RFA Bilateral    femoral vein ([place] [facility])  . COLONOSCOPY WITH PROPOFOL N/A 02/11/2016   Procedure: COLONOSCOPY WITH PROPOFOL;  Surgeon: Lollie Sails, MD;  Location: [facility] ENDOSCOPY;  Service: Endoscopy;  Laterality: N/A;  . HYSTERECTOMY ABDOMINAL WITH SALPINGECTOMY Bilateral 04/06/2016   Procedure: HYSTERECTOMY ABDOMINAL WITH BILATERAL SALPINGECTOMY;  Surgeon: Rubie Maid, MD;  Location: [facility] ORS;  Service: Gynecology;  Laterality: Bilateral;  . MOUTH SURGERY  2001  . [place]  Family History  Problem Relation Age of Onset  . Birth defects Mother     ablation in heart  . Hearing loss Father   MD note: sister - microcytosis w/hypochromia, probable alpha-thal  Social History  Substance Use Topics  . Smoking status: Former Smoker    Packs/day: 1.50    Years: 15.00    Types: Cigarettes    Quit date: 10/06/1999  . Smokeless tobacco: Never Used     Comment: quit in 2001  . Alcohol use 1.8 - 2.4 oz/week    1 Glasses of wine, 1 Cans of beer, 1 - 2  Standard drinks or equivalent per week     Comment: occasional beer or wine   Interim medical history since last visit reviewed. Allergies and medications reviewed  Review of Systems Per HPI unless specifically indicated above     Objective:    BP (!) 144/86   Pulse 90   Temp 97.9 F (36.6 C) (Oral)   Resp 14   Wt 218 lb (98.9 kg)   LMP 04/07/2015   SpO2 97%   BMI 38.62 kg/m   Wt Readings from Last 3 Encounters:  08/10/16 218 lb (98.9 kg)  06/04/16 209 lb (94.8 kg)  05/21/16 206 lb 11.2 oz (93.8 kg)    Physical Exam  Constitutional: She appears well-developed and well-nourished.  HENT:  Head: Normocephalic and atraumatic.  Right Ear: Hearing, tympanic membrane, external ear and ear canal normal. Tympanic membrane is not erythematous. No middle ear effusion.  Left Ear: Hearing, external ear and ear canal normal. Tympanic membrane is erythematous (mild hyperemia).  No middle ear effusion.  Nose: No rhinorrhea.  Mouth/Throat: Mucous membranes are normal.  Eyes: Conjunctivae and EOM are normal. Right eye exhibits no hordeolum. Left eye exhibits no hordeolum. No scleral icterus.  Neck: Carotid bruit is not present. No thyromegaly present.  Cardiovascular: Normal rate, regular rhythm, S1 normal, S2 normal and normal heart sounds.   No extrasystoles are present.  Pulmonary/Chest: Effort normal and breath sounds normal. No respiratory distress. Right breast exhibits no inverted nipple, no mass, no nipple discharge, no skin change and no tenderness. Left breast exhibits no inverted nipple, no mass, no nipple discharge, no skin change and no tenderness. Breasts are symmetrical.  Abdominal: Soft. Normal appearance and bowel sounds are normal. She exhibits no distension, no abdominal bruit, no pulsatile midline mass and no mass. There is no hepatosplenomegaly. There is no tenderness. No hernia.  Musculoskeletal: Normal range of motion. She exhibits no edema.       Arms: Large, soft mass  on the RIGHT side of back at braline level; consistency similar to that of a lipoma; no hardened area; no overlying skin changes  Lymphadenopathy:    She has no cervical adenopathy.    She has no axillary adenopathy.  Neurological: She is alert. She displays no tremor. No cranial nerve deficit. She exhibits normal muscle tone. Gait normal.  Reflex Scores:      Patellar reflexes are 2+ on the right side and 2+ on the left side. Skin: Skin is warm and dry. Lesion (dark firm papule on upper LEFT side back, less papular lesion upper RIGHT side back; dark papule on RIGHT medial calf with positive Fitzpatrick's sign) noted. No bruising and no ecchymosis noted. No cyanosis. No pallor.  Psychiatric: Her speech is normal and behavior is normal. Thought content normal. Her mood appears not anxious. She does not exhibit a depressed mood.   Results for orders placed  or performed in visit on 08/03/16  POCT Influenza A/B (POC66)  Result Value Ref Range   Influenza A, POC Negative Negative   Influenza B, POC Negative Negative      Assessment & Plan:   Problem List Items Addressed This Visit      Endocrine   IFG (impaired fasting glucose)    Check A1c      Relevant Orders   Hemoglobin A1c     Other   Screening for HIV (human immunodeficiency virus)    Check HIV      Relevant Orders   HIV antibody   Preventative health care - Primary    USPSTF grade A and B recommendations reviewed with patient; age-appropriate recommendations, preventive care, screening tests, etc discussed and encouraged; healthy living encouraged; see AVS for patient education given to patient      Relevant Orders   TSH   Lipid panel   CBC with Differential/Platelet   COMPLETE METABOLIC PANEL WITH GFR   Iron deficiency anemia    Check ferritin      Relevant Orders   Ferritin   Hot flashes due to surgical menopause    Patient agrees with checking LH and FSH      Relevant Orders   LH   Follicle stimulating  hormone   Decreased hearing of right ear    Check for wax, in-house screen, refer to audiology if needed       Other Visit Diagnoses    Benign tumor of back       Relevant Orders   Ambulatory referral to General Surgery      Follow up plan: Return in about 1 year (around 08/10/2017) for complete physical.  An after-visit summary was printed and given to the patient at [facility].  Please see the patient instructions which may contain other information and recommendations beyond what is mentioned above in the assessment and plan.  Orders Placed This Encounter  Procedures  . TSH  . Lipid panel  . CBC with Differential/Platelet  . Hemoglobin A1c  . Ferritin  . COMPLETE METABOLIC PANEL WITH GFR  . LH  . Follicle stimulating hormone  . HIV antibody  . Ambulatory referral to General Surgery  Hearing test: normal per staff

## 2016-08-10 NOTE — Assessment & Plan Note (Signed)
Patient agrees with checking LH and [facility]

## 2016-08-11 ENCOUNTER — Ambulatory Visit: Payer: Self-pay | Admitting: Physician Assistant

## 2016-08-11 ENCOUNTER — Encounter: Payer: Self-pay | Admitting: Physician Assistant

## 2016-08-11 VITALS — BP 166/98 | HR 88 | Temp 98.4°F

## 2016-08-11 DIAGNOSIS — J069 Acute upper respiratory infection, unspecified: Secondary | ICD-10-CM

## 2016-08-11 LAB — COMPLETE METABOLIC PANEL WITH GFR
ALBUMIN: 3.9 g/dL (ref 3.6–5.1)
ALK PHOS: 110 U/L (ref 33–115)
ALT: 15 U/L (ref 6–29)
AST: 22 U/L (ref 10–35)
BILIRUBIN TOTAL: 0.4 mg/dL (ref 0.2–1.2)
BUN: 17 mg/dL (ref 7–25)
CALCIUM: 9.6 mg/dL (ref 8.6–10.2)
CO2: 31 mmol/L (ref 20–31)
Chloride: 99 mmol/L (ref 98–110)
Creat: 0.74 mg/dL (ref 0.50–1.10)
GFR, Est African American: >89 mL/min (ref >=60)
GLUCOSE: 101 mg/dL — AB (ref 65–99)
Potassium: 4.3 mmol/L (ref 3.5–5.3)
SODIUM: 139 mmol/L (ref 135–146)
TOTAL PROTEIN: 7.1 g/dL (ref 6.1–8.1)

## 2016-08-11 LAB — LIPID PANEL
CHOL/HDL RATIO: 4.6 ratio (ref <5.0)
CHOLESTEROL: 237 mg/dL — AB (ref <200)
HDL: 51 mg/dL (ref >50)
LDL CALC: 162 mg/dL — AB
TRIGLYCERIDES: 122 mg/dL (ref <150)
VLDL: 24 mg/dL (ref <30)

## 2016-08-11 LAB — HIV ANTIBODY (ROUTINE TESTING W REFLEX): HIV: NONREACTIVE

## 2016-08-11 LAB — HEMOGLOBIN A1C
HEMOGLOBIN A1C: 6.4 % — AB (ref <5.7)
Mean Plasma Glucose: 137 mg/dL

## 2016-08-11 LAB — LUTEINIZING HORMONE: LH: 28.7 m[IU]/mL

## 2016-08-11 LAB — TSH: TSH: 0.56 mIU/L

## 2016-08-11 LAB — FERRITIN: Ferritin: 40 ng/mL (ref 10–232)

## 2016-08-11 LAB — FOLLICLE STIMULATING HORMONE: FSH: 63.8 m[IU]/mL

## 2016-08-11 MED ORDER — METHYLPREDNISOLONE 4 MG PO TBPK: ORAL_TABLET | ORAL | 0 refills | Status: DC

## 2016-08-11 MED ORDER — ALBUTEROL SULFATE HFA 108 (90 BASE) MCG/ACT IN AERS: 2.0000 | INHALATION_SPRAY | Freq: Four times a day (QID) | RESPIRATORY_TRACT | 0 refills | Status: DC | PRN

## 2016-08-11 NOTE — Progress Notes (Signed)
S: states is feeling better from zpack, is worried bc her right ear and r side of throat is hurting, thought she had pink eye last week and used an otc pink eye drop, eye was watering and matting, has cleared now, denies fever, chills, cp/sob, states the cough is bad and worse at night, states green mucus has gone away  O: vitals wnl, nad, perrl eomi conjunctiva wnl, tms clear, nasal mucosa wnl, throat slightly injected but no redness/exudate noted, neck supple, tender along r tonsilar gland, lungs c t a, cv rrr  A: resolving uri with lingering cough  P: medrol dose pack, albuterol inhaler

## 2016-08-14 ENCOUNTER — Encounter: Payer: Self-pay | Admitting: Family Medicine

## 2016-08-26 ENCOUNTER — Encounter: Payer: Self-pay | Admitting: *Deleted

## 2016-09-07 ENCOUNTER — Other Ambulatory Visit: Payer: Self-pay | Admitting: Family Medicine

## 2016-09-08 ENCOUNTER — Encounter: Payer: Self-pay | Admitting: Family Medicine

## 2016-09-08 DIAGNOSIS — R222 Localized swelling, mass and lump, trunk: Secondary | ICD-10-CM

## 2016-09-08 NOTE — Assessment & Plan Note (Signed)
Refer to derm instead per patient's request

## 2016-09-24 ENCOUNTER — Ambulatory Visit: Payer: Self-pay | Attending: Family Medicine

## 2016-09-24 DIAGNOSIS — R262 Difficulty in walking, not elsewhere classified: Secondary | ICD-10-CM | POA: Insufficient documentation

## 2016-09-24 DIAGNOSIS — M25561 Pain in right knee: Secondary | ICD-10-CM | POA: Insufficient documentation

## 2016-09-24 NOTE — Therapy (Signed)
[place] MAIN [facility] SERVICES [address] [place], [place], [address] Phone: [phone]   Fax:  [phone]  Patient Details  Name: Natasha Mitchell MRN: [id number] Date of Birth: November 06, 1965 Referring Provider:  Arnetha Courser, MD  Encounter Date: 09/24/2016   PT/OT/SLP Screening Form   Time: in 1605   Time out: 1635   Complaint R knee pain Past Medical Hx:  R knee pain since 1996, recent hysterectomy Injury Date:09/17/16  Pain Scale: 6/10 Patient's phone number:   Hx (this occurrence):  R knee pain with radiating down the anterior aspect of the knee. Injury in the R knee since 1996 when going into hyperextension when lifting a patient.  Patient states the pain is worse in the morning. Onset of knee pain 2 weeks when she was walking with a boot. Patient reports 8/10 current pain in the knee, 10/10 worst pain, 3/10 best pain. Reports the pain feels like a dull ache. Aggravation of pain when walking, (have not tried stairs), pushing beds. 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 Patient reports increased pain along  The anterior aspect into the posterior aspect of the knee. Decreased pain withice and non weight bearing positions. Decreased pain when wearing high heeled shoes    Assessment: Ely's and Thessely's test negative for meniscus involvement; all ligamentous tests did not reproduce pain or show any sign of laxity. Patient's AROM and MMT were WNL. Increased pain with weight bearing with positive push off test. Increased TTP along lateral gastrocnemius tendon and increased pain with dorsiflexion AROM with overpressure. Current limitations indicate lateral gastrocnemius tendon involvement on the R side with possible strain. Educated patient on exercises to perform and to avoid dorsiflexion for prolonged periods of time. Recommend further follow up with PT.    Recommendations:    Comments: Recommended follow up with PT    []  Patient  would benefit from an MD referral [x]  Patient would benefit from a full PT/OT/ SLP evaluation and treatment. []  No intervention recommended at this time.    Blythe Stanford, PT DPT 09/24/2016, 4:07 PM  [place] MAIN [facility] SERVICES [address] [place], [place], [address] Phone: [phone]   Fax:  [phone]

## 2016-10-06 ENCOUNTER — Other Ambulatory Visit: Payer: Self-pay

## 2016-10-06 DIAGNOSIS — I10 Essential (primary) hypertension: Secondary | ICD-10-CM

## 2016-10-07 MED ORDER — HYDROCHLOROTHIAZIDE 25 MG PO TABS: 25.0000 mg | ORAL_TABLET | Freq: Every day | ORAL | 1 refills | Status: DC

## 2016-10-07 NOTE — Telephone Encounter (Signed)
Nov Cr and K+ reviewed; Rx approved

## 2016-11-12 ENCOUNTER — Encounter: Payer: Self-pay | Admitting: Family Medicine

## 2016-12-29 DIAGNOSIS — D171 Benign lipomatous neoplasm of skin and subcutaneous tissue of trunk: Secondary | ICD-10-CM | POA: Diagnosis not present

## 2017-01-14 ENCOUNTER — Other Ambulatory Visit: Payer: Self-pay | Admitting: Family Medicine

## 2017-01-14 DIAGNOSIS — Z1231 Encounter for screening mammogram for malignant neoplasm of breast: Secondary | ICD-10-CM

## 2017-01-25 ENCOUNTER — Encounter: Payer: Self-pay | Admitting: General Surgery

## 2017-01-25 ENCOUNTER — Ambulatory Visit (INDEPENDENT_AMBULATORY_CARE_PROVIDER_SITE_OTHER): Payer: 59 | Admitting: General Surgery

## 2017-01-25 VITALS — BP 152/94 | HR 92 | Temp 98.3°F | Ht 63.0 in | Wt 224.0 lb

## 2017-01-25 DIAGNOSIS — R222 Localized swelling, mass and lump, trunk: Secondary | ICD-10-CM | POA: Diagnosis not present

## 2017-01-25 NOTE — Patient Instructions (Signed)
We have scheduled your surgery for the following date 02/11/17 with Dr. Adonis Huguenin.  Please refer to your blue surgery sheet.

## 2017-01-25 NOTE — Progress Notes (Signed)
Patient ID: Natasha Mitchell, female   DOB: 1966/01/21, 51 y.o.   MRN: [id number]  CC: Back Mass  HPI Natasha Mitchell is a 51 y.o. female presents clinic for evaluation of a soft tissue mass on the right side of her back. Patient works is been there for many years but over the last several months has grown in size. It is becoming more noticeable visually and she can feel when she lays back or leans against something hard. It has increasingly been a new since and she is here today to discuss having removed. There is never been infected or drained to her knowledge. It does not hurt when she does not have pressure to it. She is otherwise in her usual state of health and denies any fevers, chills, nausea, vomiting, chest pain, shortness of breath, diarrhea, constipation.  HPI  Past Medical History:  Diagnosis Date  . Anxiety   . Depression   . Family hx of colon cancer 02/15/2016  . History of uterine fibroid    s/p abdominal hysterectomy with LSO and bilateral salpingectomy 03/2016  . Hypertension   . Intermittent palpitations   . Prediabetes   . Vein, varicose 2016    Past Surgical History:  Procedure Laterality Date  . ABLATION SAPHENOUS VEIN W/ RFA Bilateral    femoral vein ( vein treatment center)  . COLONOSCOPY WITH PROPOFOL N/A 02/11/2016   Procedure: COLONOSCOPY WITH PROPOFOL;  Surgeon: Natasha Sails, MD;  Location: [facility] ENDOSCOPY;  Service: Endoscopy;  Laterality: N/A;  . HYSTERECTOMY ABDOMINAL WITH SALPINGECTOMY Bilateral 04/06/2016   Procedure: HYSTERECTOMY ABDOMINAL WITH BILATERAL SALPINGECTOMY;  Surgeon: Natasha Maid, MD;  Location: [facility] ORS;  Service: Gynecology;  Laterality: Bilateral;  . MOUTH SURGERY  2001  . TUBAL LIGATION  1998    Family History  Problem Relation Age of Onset  . Birth defects Mother     ablation in heart  . Hearing loss Father     Social History Social History  Substance Use Topics  . Smoking status: Former Smoker    Packs/day:  1.50    Years: 15.00    Types: Cigarettes    Quit date: 10/06/1999  . Smokeless tobacco: Never Used     Comment: quit in 2001  . Alcohol use 1.8 - 2.4 oz/week    1 Glasses of wine, 1 Cans of beer, 1 - 2 Standard drinks or equivalent per week     Comment: occasional beer or wine    Allergies  Allergen Reactions  . Delsym Cough Relief [Dextromethorphan-Menthol] Shortness Of Breath  . Other Technical brewer  . Latex Rash  . Motrin [Ibuprofen] Rash  . Mushroom Extract Complex Nausea Only and Palpitations    Current Outpatient Prescriptions  Medication Sig Dispense Refill  . albuterol (PROVENTIL HFA;VENTOLIN HFA) 108 (90 Base) MCG/ACT inhaler Inhale 2 puffs into the lungs every 6 (six) hours as needed for wheezing or shortness of breath. 1 Inhaler 0  . Biotin 5000 MCG CAPS Take 1 capsule by mouth daily.    . Cholecalciferol (VITAMIN D3) 1000 units CAPS Take 1 capsule by mouth daily.     Marland Kitchen glucose blood (TRUE METRIX BLOOD GLUCOSE TEST) test strip Use as instructed, check FSBS daily as desired; ICD-10 code R73.01; LON 6 months 100 each 1  . hydrochlorothiazide (HYDRODIURIL) 25 MG tablet Take 1 tablet (25 mg total) by mouth daily. 90 tablet 1  . Multiple Vitamin (MULTIVITAMIN) tablet Take 1 tablet by mouth daily.  In am    . Omega-3 Fatty Acids (FISH OIL PO) Take 1,000 mg by mouth daily. In am    . OVER THE COUNTER MEDICATION Take 1 tablet by mouth daily. blackseed    . polyethylene glycol powder (GLYCOLAX/MIRALAX) powder Take 1 Container by mouth daily. 1 scoop per day  0  . verapamil (VERELAN PM) 180 MG 24 hr capsule Take 1 capsule (180 mg total) by mouth at bedtime. 90 capsule 3  . vitamin E 400 UNIT capsule Take 400 Units by mouth daily. In am.     No current facility-administered medications for this visit.      Review of Systems A Multi-point review of systems was asked and was negative except for the findings documented in the history of present illness  Physical  Exam Blood pressure (!) 152/94, pulse 92, temperature 98.3 F (36.8 C), temperature source Oral, height 5\' 3"  (1.6 m), weight 101.6 kg (224 lb), last menstrual period 04/01/2016. CONSTITUTIONAL: No acute distress. EYES: Pupils are equal, round, and reactive to light, Sclera are non-icteric. EARS, NOSE, MOUTH AND THROAT: The oropharynx is clear. The oral mucosa is pink and moist. Hearing is intact to voice. LYMPH NODES:  Lymph nodes in the neck are normal. RESPIRATORY:  Lungs are clear. There is normal respiratory effort, with equal breath sounds bilaterally, and without pathologic use of accessory muscles. CARDIOVASCULAR: Heart is regular without murmurs, gallops, or rubs. GI: The abdomen is soft, nontender, and nondistended. There are no palpable masses. There is no hepatosplenomegaly. There are normal bowel sounds in all quadrants. GU: Rectal deferred.   MUSCULOSKELETAL: Normal muscle strength and tone. No cyanosis or edema.   SKIN: Visible and palpable soft tissue mass just the right of midline of the mid back. Measures 6 x 6 cm. NEUROLOGIC: Motor and sensation is grossly normal. Cranial nerves are grossly intact. PSYCH:  Oriented to person, place and time. Affect is normal.  Data Reviewed No recent labs or images to review I have personally reviewed the patient's imaging, laboratory findings and medical records.    Assessment    Soft tissue mass just to the right of midline in the mid back    Plan    51 year old female with a soft tissue mass on the right side of her mid back. Discussed that this is likely a lipoma. Given its size, location, symptoms discussed that we should remove this in the operating room. The procedure itself of a soft tissue mass excision was described in detail to include the risks, benefits, alternatives. Patient voiced understanding and all questions were answered satisfaction. Plan for surgical removal on Thursday, May 10.     Time spent with the patient  was 30 minutes, with more than 50% of the time spent in face-to-face education, counseling and care coordination.     Natasha Pert, MD FACS General Surgeon 01/25/2017, 2:21 PM

## 2017-01-27 ENCOUNTER — Telehealth: Payer: Self-pay

## 2017-01-27 NOTE — Telephone Encounter (Signed)
Patient is calling to change her surgery date. She was not able to take 02/11/17 off. She would like to know if he has anything open for 05/14-05/17. She is willing to take 05/13 off if neccessary. Please call patient and advice.

## 2017-01-27 NOTE — Telephone Encounter (Signed)
Will discuss reschedule with Dr. Adonis Huguenin. I will return phone call to patient after surgery has been rescheduled.

## 2017-01-29 ENCOUNTER — Telehealth: Payer: Self-pay

## 2017-01-29 NOTE — Telephone Encounter (Signed)
Spoke with Dr. Adonis Huguenin. Patient's surgery has been rescheduled to 02/18/17. 0730 start at [facility].   Case has been moved.   Call made to patient to advise of Surgery Date as well as Pre-Admission appointment date, time, and location. No answer. Left voicemail for return phone call.   If patient returns phone call, surgery information is below:  Surgery Date: 02/18/17  Pre-admit Appointment: 02/03/17 from 9a-1p (Phone)  Patient has been advised to call [phone] the day before surgery between 1-3pm to obtain arrival time.

## 2017-01-29 NOTE — Telephone Encounter (Signed)
NO authorization required for CPT code 21920 per Tobe Sos. @ [facility]

## 2017-01-29 NOTE — Telephone Encounter (Signed)
Call made to patient once again at this time to give surgery information as well as pre-admission phone interview time/date.   No answer.  Left voicemail for return phone call for patient.

## 2017-02-02 NOTE — Telephone Encounter (Signed)
I have reviewed the surgery date and pre-admit time with the patient. She has something on the 2nd and needs to change her pre-admit appointment. I gave her the number to pre-admit so she can call and change it. I also gave the patient to call the day prior to surgery. Patient understood and had no further questions.

## 2017-02-03 ENCOUNTER — Encounter
Admission: RE | Admit: 2017-02-03 | Discharge: 2017-02-03 | Disposition: A | Discharge status: Home / Self Care | Payer: 59 | Source: Ambulatory Visit | Attending: General Surgery | Admitting: General Surgery

## 2017-02-03 HISTORY — DX: Unspecified osteoarthritis, unspecified site: M19.90

## 2017-02-03 HISTORY — DX: Headache: R51

## 2017-02-03 HISTORY — DX: Headache, unspecified: R51.9

## 2017-02-03 HISTORY — DX: Anemia, unspecified: D64.9

## 2017-02-03 NOTE — Patient Instructions (Addendum)
  Your procedure is scheduled on: 02-18-17 (Thursday) Report to Same Day Surgery 2nd floor medical mall [facility] Entrance-take elevator on left to 2nd floor.  Check in with surgery information desk.) To find out your arrival time please call [phone] between 1PM - 3PM on 02-17-17 (Wednesday)  Remember: Instructions that are not followed completely may result in serious medical risk, up to and including death, or upon the discretion of your surgeon and anesthesiologist your surgery may need to be rescheduled.    _x___ 1. Do not eat food or drink liquids after midnight. No gum chewing or hard candies.     __x__ 2. No Alcohol for 24 hours before or after surgery.   __x__3. No Smoking for 24 prior to surgery.   ____  4. Bring all medications with you on the day of surgery if instructed.    __x__ 5. Notify your doctor if there is any change in your medical condition     (cold, fever, infections).     Do not wear jewelry, make-up, hairpins, clips or nail polish.  Do not wear lotions, powders, or perfumes. You may wear deodorant.  Do not shave 48 hours prior to surgery. Men may shave face and neck.  Do not bring valuables to the hospital.    [facility] is not responsible for any belongings or valuables.               Contacts, dentures or bridgework may not be worn into surgery.  Leave your suitcase in the car. After surgery it may be brought to your room.  For patients admitted to the hospital, discharge time is determined by your  treatment team.   Patients discharged the day of surgery will not be allowed to drive home.  You will need someone to drive you home and stay with you the night of your procedure.    Please read over the following fact sheets that you were given:     _x___ Take anti-hypertensive (unless it includes a diuretic), cardiac, seizure, asthma,     anti-reflux and psychiatric medicines. These include:  1. VERAPAMIL  2.   3.  4.  5.  6.  ____Fleets enema  or Magnesium Citrate as directed.   _x___ Use CHG Soap or sage wipes as directed on instruction sheet   ____ Use inhalers on the day of surgery and bring to hospital day of surgery  ____ Stop Metformin and Janumet 2 days prior to surgery.    ____ Take 1/2 of usual insulin dose the night before surgery and none on the morning surgery.   ____ Follow recommendations from Cardiologist, Pulmonologist or PCP regarding stopping Aspirin, Coumadin, Pllavix ,Eliquis, Effient, or Pradaxa, and Pletal.  X____Stop Anti-inflammatories such as Advil, Aleve, Ibuprofen, Motrin, Naproxen, Naprosyn, Goodies powders, EXCEDRIN or aspirin products 7 DAYS PRIOR TO SURGERY-OK to take Tylenol    _x___ Stop supplements until after surgery-STOP FISH OIL, BIOTIN, FAT BURNER  AND VITAMIN E 7 DAYS PRIOR TO SURGERY-MAY RESUME AFTER SURGERY   ____ Bring C-Pap to the hospital.

## 2017-02-04 ENCOUNTER — Encounter
Admission: RE | Admit: 2017-02-04 | Discharge: 2017-02-04 | Disposition: A | Discharge status: Home / Self Care | Payer: 59 | Source: Ambulatory Visit | Attending: General Surgery | Admitting: General Surgery

## 2017-02-04 ENCOUNTER — Ambulatory Visit
Admission: RE | Admit: 2017-02-04 | Discharge: 2017-02-04 | Disposition: A | Discharge status: Home / Self Care | Payer: 59 | Source: Ambulatory Visit | Attending: Family Medicine | Admitting: Family Medicine

## 2017-02-04 DIAGNOSIS — Z01810 Encounter for preprocedural cardiovascular examination: Secondary | ICD-10-CM | POA: Diagnosis not present

## 2017-02-04 DIAGNOSIS — Z01812 Encounter for preprocedural laboratory examination: Secondary | ICD-10-CM | POA: Insufficient documentation

## 2017-02-04 DIAGNOSIS — I10 Essential (primary) hypertension: Secondary | ICD-10-CM | POA: Diagnosis not present

## 2017-02-04 DIAGNOSIS — Z1231 Encounter for screening mammogram for malignant neoplasm of breast: Secondary | ICD-10-CM | POA: Diagnosis not present

## 2017-02-04 LAB — BASIC METABOLIC PANEL
Anion gap: 7 (ref 5–15)
BUN: 16 mg/dL (ref 6–20)
CALCIUM: 9.6 mg/dL (ref 8.9–10.3)
CHLORIDE: 102 mmol/L (ref 101–111)
CO2: 30 mmol/L (ref 22–32)
Creatinine, Ser: 0.65 mg/dL (ref 0.44–1.00)
GFR calc Af Amer: >60 mL/min (ref >60)
GFR calc non Af Amer: >60 mL/min (ref >60)
Glucose, Bld: 114 mg/dL — ABNORMAL HIGH (ref 65–99)
Potassium: 3.6 mmol/L (ref 3.5–5.1)
SODIUM: 139 mmol/L (ref 135–145)

## 2017-02-04 LAB — HEMOGLOBIN: Hemoglobin: 12.7 g/dL (ref 12.0–16.0)

## 2017-02-09 ENCOUNTER — Encounter (INDEPENDENT_AMBULATORY_CARE_PROVIDER_SITE_OTHER): Payer: Self-pay | Admitting: Orthopedic Surgery

## 2017-02-09 ENCOUNTER — Ambulatory Visit (INDEPENDENT_AMBULATORY_CARE_PROVIDER_SITE_OTHER): Payer: 59 | Admitting: Orthopedic Surgery

## 2017-02-09 ENCOUNTER — Ambulatory Visit (INDEPENDENT_AMBULATORY_CARE_PROVIDER_SITE_OTHER): Payer: 59

## 2017-02-09 VITALS — Ht 63.0 in | Wt 224.0 lb

## 2017-02-09 DIAGNOSIS — M25561 Pain in right knee: Secondary | ICD-10-CM

## 2017-02-09 DIAGNOSIS — G8929 Other chronic pain: Secondary | ICD-10-CM

## 2017-02-09 MED ORDER — METHYLPREDNISOLONE ACETATE 40 MG/ML IJ SUSP
40.0000 mg | INTRAMUSCULAR | Status: AC | PRN
  Administered 2017-02-09: 40 mg via INTRA_ARTICULAR

## 2017-02-09 MED ORDER — LIDOCAINE HCL 1 % IJ SOLN
5.0000 mL | INTRAMUSCULAR | Status: AC | PRN
  Administered 2017-02-09: 5 mL

## 2017-02-09 NOTE — Progress Notes (Signed)
Office Visit Note   Patient: Natasha Mitchell           Date of Birth: Dec 27, 1965           MRN: [id number] Visit Date: 02/09/2017              Requested by: Arnetha Courser, MD [address] [place], [place] [address] PCP: Arnetha Courser, MD  Chief Complaint  Patient presents with  . Right Knee - Pain      HPI: Patient is a 51 year old woman who reports some right knee pain since 2008. Patient states that she was working moving a patient hyperextended her knee and felt a pop she most likely injured her anterior cruciate ligament at this time she has been having on and off pain and symptoms in her right knee since that time she is currently wearing a neoprene brace which she states makes it feel better. Patient complains of swelling after prolonged activities meal joint line as well as these suprapatellar pouch.  Assessment & Plan: Visit Diagnoses:  1. Chronic pain of right knee     Plan: The right knee was injected from the anteromedial portal C Dorothea Ogle this well plan follow-up in 4 weeks. Discussed that we can consider repeat injections consider hyaluronic acid injection.  Follow-Up Instructions: Return in about 4 weeks (around 03/09/2017).   Ortho Exam  Patient is alert, oriented, no adenopathy, well-dressed, normal affect, normal respiratory effort. Patient has an antalgic gait she has swelling in the suprapatellar pouch as well as the medial joint line and medial joint line is tender to palpation the patellofemoral joint line is tender to palpation collaterals and cruciates are stable. There is no redness no cellulitis no open wounds.  Imaging: Xr Knee 1-2 Views Right  Result Date: 02/09/2017 2 view radiographs of the right knee shows tricompartmental arthritic changes patient has large osteophytic bone spurs in the medial joint line of the right knee was essentially bone-on-bone contact in the subcondylar sclerosis as well as subcondylar cysts. Lateral  radiograph shows bony spurs in the inferior and superior aspect the patella.   Labs: Lab Results  Component Value Date   HGBA1C 6.4 (H) 08/10/2016   HGBA1C 5.9 (H) 01/21/2016   HGBA1C 6.2 (H) 06/18/2015    Orders:  Orders Placed This Encounter  Procedures  . XR Knee 1-2 Views Right   No orders of the defined types were placed in this encounter.    Procedures: Large Joint Inj Date/Time: 02/09/2017 1:43 PM Performed by: DUDA, MARCUS V Authorized by: Newt Minion   Consent Given by:  Patient Site marked: the procedure site was marked   Timeout: prior to procedure the correct patient, procedure, and site was verified   Indications:  Pain and diagnostic evaluation Location:  Knee Site:  R knee Prep: patient was prepped and draped in usual sterile fashion   Needle Size:  22 G Needle Length:  1.5 inches Approach:  Anteromedial Ultrasound Guidance: No   Fluoroscopic Guidance: No   Arthrogram: No   Medications:  5 mL lidocaine 1 %; 40 mg methylPREDNISolone acetate 40 MG/ML Aspiration Attempted: No   Patient tolerance:  Patient tolerated the procedure well with no immediate complications    Clinical Data: No additional findings.  ROS:  All other systems negative, except as noted in the HPI. Review of Systems  Objective: Vital Signs: Ht 5\' 3"  (1.6 m)   Wt 224 lb (101.6 kg)   LMP 04/01/2016  BMI 39.68 kg/m   Specialty Comments:  No specialty comments available.  PMFS History: Patient Active Problem List   Diagnosis Date Noted  . Mass on back 09/08/2016  . Hot flashes due to surgical menopause 08/10/2016  . Screening for HIV (human immunodeficiency virus) 08/10/2016  . Preventative health care 08/10/2016  . Decreased hearing of right ear 08/10/2016  . Family hx of colon cancer 02/15/2016  . Iron deficiency anemia 01/26/2016  . Anxiety disorder 01/21/2016  . IFG (impaired fasting glucose) 01/21/2016  . Microcytosis 01/21/2016  . Encounter for screening  for malignant neoplasm of colon 07/18/2015  . Hypertension goal BP (blood pressure) < 140/90 06/18/2015  . Obesity, Class I, BMI 30-34.9 06/18/2015  . Hot flashes 06/18/2015  . Obesity, unspecified 06/18/2015   Past Medical History:  Diagnosis Date  . Anemia    H/O PRIOR TO HYSTERECTOMY  . Anxiety   . Arthritis   . Depression   . Family hx of colon cancer 02/15/2016  . Headache   . History of uterine fibroid    s/p abdominal hysterectomy with LSO and bilateral salpingectomy 03/2016  . Hypertension   . Intermittent palpitations    SINCE TAKING VERAPAMIL AND DECREASING CAFFEINE INTAKE, PALITATIONS ARE VERY MINIMAL PER PT (02-03-17)  . Prediabetes   . Vein, varicose 2016    Family History  Problem Relation Age of Onset  . Birth defects Mother     ablation in heart  . Hearing loss Father   . Breast cancer Neg Hx     Past Surgical History:  Procedure Laterality Date  . ABLATION SAPHENOUS VEIN W/ RFA Bilateral    femoral vein ([facility])  . COLONOSCOPY WITH PROPOFOL N/A 02/11/2016   Procedure: COLONOSCOPY WITH PROPOFOL;  Surgeon: Lollie Sails, MD;  Location: [facility] ENDOSCOPY;  Service: Endoscopy;  Laterality: N/A;  . HYSTERECTOMY ABDOMINAL WITH SALPINGECTOMY Bilateral 04/06/2016   Procedure: HYSTERECTOMY ABDOMINAL WITH BILATERAL SALPINGECTOMY;  Surgeon: Rubie Maid, MD;  Location: [facility] ORS;  Service: Gynecology;  Laterality: Bilateral;  . MOUTH SURGERY  2001  . TUBAL LIGATION  1998   Social History   Occupational History  . Not on file.   Social History Main Topics  . Smoking status: Former Smoker    Packs/day: 1.50    Years: 15.00    Types: Cigarettes    Quit date: 10/06/1999  . Smokeless tobacco: Never Used     Comment: quit in 2001  . Alcohol use 1.8 - 2.4 oz/week    1 Glasses of wine, 1 Cans of beer, 1 - 2 Standard drinks or equivalent per week     Comment: occasional beer or wine  . Drug use: No  . Sexual activity: No

## 2017-02-11 ENCOUNTER — Ambulatory Visit (INDEPENDENT_AMBULATORY_CARE_PROVIDER_SITE_OTHER): Payer: 59 | Admitting: Orthopedic Surgery

## 2017-02-18 ENCOUNTER — Ambulatory Visit: Payer: 59 | Admitting: Anesthesiology

## 2017-02-18 ENCOUNTER — Ambulatory Visit
Admission: RE | Admit: 2017-02-18 | Discharge: 2017-02-18 | Disposition: A | Discharge status: Home / Self Care | Payer: 59 | Source: Ambulatory Visit | Attending: General Surgery | Admitting: General Surgery

## 2017-02-18 ENCOUNTER — Encounter: Payer: Self-pay | Admitting: *Deleted

## 2017-02-18 ENCOUNTER — Encounter
Admission: RE | Disposition: A | Discharge status: Home / Self Care | Payer: Self-pay | Source: Ambulatory Visit | Attending: General Surgery

## 2017-02-18 DIAGNOSIS — Z87891 Personal history of nicotine dependence: Secondary | ICD-10-CM | POA: Diagnosis not present

## 2017-02-18 DIAGNOSIS — Z79899 Other long term (current) drug therapy: Secondary | ICD-10-CM | POA: Insufficient documentation

## 2017-02-18 DIAGNOSIS — R222 Localized swelling, mass and lump, trunk: Secondary | ICD-10-CM | POA: Diagnosis present

## 2017-02-18 DIAGNOSIS — I739 Peripheral vascular disease, unspecified: Secondary | ICD-10-CM | POA: Insufficient documentation

## 2017-02-18 DIAGNOSIS — D1779 Benign lipomatous neoplasm of other sites: Secondary | ICD-10-CM | POA: Diagnosis not present

## 2017-02-18 DIAGNOSIS — I10 Essential (primary) hypertension: Secondary | ICD-10-CM | POA: Diagnosis not present

## 2017-02-18 DIAGNOSIS — D171 Benign lipomatous neoplasm of skin and subcutaneous tissue of trunk: Secondary | ICD-10-CM

## 2017-02-18 HISTORY — PX: LIPOMA EXCISION: SHX5283

## 2017-02-18 LAB — GLUCOSE, CAPILLARY: Glucose-Capillary: 105 mg/dL — ABNORMAL HIGH (ref 65–99)

## 2017-02-18 SURGERY — EXCISION LIPOMA
Anesthesia: General | Laterality: Right | Wound class: Clean

## 2017-02-18 MED ORDER — LIDOCAINE HCL (CARDIAC) 20 MG/ML IV SOLN
INTRAVENOUS | Status: DC | PRN
  Administered 2017-02-18: 50 mg via INTRAVENOUS

## 2017-02-18 MED ORDER — DEXAMETHASONE SODIUM PHOSPHATE 10 MG/ML IJ SOLN
INTRAMUSCULAR | Status: AC
  Filled 2017-02-18: qty 1

## 2017-02-18 MED ORDER — LIDOCAINE HCL (PF) 2 % IJ SOLN
INTRAMUSCULAR | Status: AC
  Filled 2017-02-18: qty 2

## 2017-02-18 MED ORDER — FENTANYL CITRATE (PF) 100 MCG/2ML IJ SOLN: 25.0000 ug | INTRAMUSCULAR | Status: DC | PRN

## 2017-02-18 MED ORDER — LIDOCAINE HCL (PF) 1 % IJ SOLN
INTRAMUSCULAR | Status: DC | PRN
  Administered 2017-02-18: 17 mL

## 2017-02-18 MED ORDER — BUPIVACAINE HCL (PF) 0.5 % IJ SOLN
INTRAMUSCULAR | Status: AC
  Filled 2017-02-18: qty 30

## 2017-02-18 MED ORDER — LIDOCAINE HCL (PF) 1 % IJ SOLN
INTRAMUSCULAR | Status: AC
  Filled 2017-02-18: qty 30

## 2017-02-18 MED ORDER — PROPOFOL 500 MG/50ML IV EMUL: INTRAVENOUS | Status: DC | PRN

## 2017-02-18 MED ORDER — FENTANYL CITRATE (PF) 100 MCG/2ML IJ SOLN
INTRAMUSCULAR | Status: DC | PRN
  Administered 2017-02-18 (×2): 25 ug via INTRAVENOUS
  Administered 2017-02-18: 50 ug via INTRAVENOUS

## 2017-02-18 MED ORDER — PHENYLEPHRINE HCL 10 MG/ML IJ SOLN
INTRAMUSCULAR | Status: AC
  Filled 2017-02-18: qty 1

## 2017-02-18 MED ORDER — PROPOFOL 10 MG/ML IV BOLUS
INTRAVENOUS | Status: AC
  Filled 2017-02-18: qty 20

## 2017-02-18 MED ORDER — PROPOFOL 500 MG/50ML IV EMUL
INTRAVENOUS | Status: DC | PRN
  Administered 2017-02-18: 50 ug/kg/min via INTRAVENOUS

## 2017-02-18 MED ORDER — CHLORHEXIDINE GLUCONATE CLOTH 2 % EX PADS: 6.0000 | MEDICATED_PAD | Freq: Once | CUTANEOUS | Status: DC

## 2017-02-18 MED ORDER — ONDANSETRON HCL 4 MG/2ML IJ SOLN: 4.0000 mg | Freq: Once | INTRAMUSCULAR | Status: DC | PRN

## 2017-02-18 MED ORDER — ONDANSETRON HCL 4 MG/2ML IJ SOLN
INTRAMUSCULAR | Status: DC | PRN
  Administered 2017-02-18: 4 mg via INTRAVENOUS

## 2017-02-18 MED ORDER — PROPOFOL 500 MG/50ML IV EMUL
INTRAVENOUS | Status: AC
  Filled 2017-02-18: qty 50

## 2017-02-18 MED ORDER — SODIUM CHLORIDE 0.9 % IV SOLN
INTRAVENOUS | Status: DC
  Administered 2017-02-18: 07:00:00 via INTRAVENOUS

## 2017-02-18 MED ORDER — EPHEDRINE SULFATE 50 MG/ML IJ SOLN
INTRAMUSCULAR | Status: AC
  Filled 2017-02-18: qty 1

## 2017-02-18 MED ORDER — MIDAZOLAM HCL 2 MG/2ML IJ SOLN
INTRAMUSCULAR | Status: DC | PRN
Start: 2017-02-18 — End: 2017-02-18
  Administered 2017-02-18: 2 mg via INTRAVENOUS

## 2017-02-18 MED ORDER — ACETAMINOPHEN 500 MG PO TABS: 1000.0000 mg | ORAL_TABLET | Freq: Four times a day (QID) | ORAL | 2 refills | Status: DC | PRN

## 2017-02-18 MED ORDER — ONDANSETRON HCL 4 MG/2ML IJ SOLN
INTRAMUSCULAR | Status: AC
  Filled 2017-02-18: qty 2

## 2017-02-18 MED ORDER — MIDAZOLAM HCL 2 MG/2ML IJ SOLN
INTRAMUSCULAR | Status: AC
  Filled 2017-02-18: qty 2

## 2017-02-18 MED ORDER — BACITRACIN ZINC 500 UNIT/GM EX OINT
TOPICAL_OINTMENT | CUTANEOUS | Status: AC
  Filled 2017-02-18: qty 28.35

## 2017-02-18 MED ORDER — FENTANYL CITRATE (PF) 100 MCG/2ML IJ SOLN
INTRAMUSCULAR | Status: AC
  Filled 2017-02-18: qty 2

## 2017-02-18 MED ORDER — SUGAMMADEX SODIUM 200 MG/2ML IV SOLN
INTRAVENOUS | Status: AC
  Filled 2017-02-18: qty 2

## 2017-02-18 MED ORDER — FAMOTIDINE 20 MG PO TABS
ORAL_TABLET | ORAL | Status: AC
  Filled 2017-02-18: qty 1

## 2017-02-18 MED ORDER — GLYCOPYRROLATE 0.2 MG/ML IJ SOLN
INTRAMUSCULAR | Status: AC
  Filled 2017-02-18: qty 1

## 2017-02-18 MED ORDER — FAMOTIDINE 20 MG PO TABS
20.0000 mg | ORAL_TABLET | Freq: Once | ORAL | Status: AC
  Administered 2017-02-18: 20 mg via ORAL

## 2017-02-18 MED ORDER — BUPIVACAINE HCL (PF) 0.5 % IJ SOLN
INTRAMUSCULAR | Status: DC | PRN
  Administered 2017-02-18: 17 mL

## 2017-02-18 SURGICAL SUPPLY — 31 items
BLADE SURG 15 STRL LF DISP TIS (BLADE) ×1 IMPLANT
BLADE SURG 15 STRL SS (BLADE) ×1
CHLORAPREP W/TINT 26ML (MISCELLANEOUS) ×2 IMPLANT
DERMABOND ADVANCED (GAUZE/BANDAGES/DRESSINGS) ×1
DERMABOND ADVANCED .7 DNX12 (GAUZE/BANDAGES/DRESSINGS) ×1 IMPLANT
DRAPE LAPAROTOMY 100X77 ABD (DRAPES) ×2 IMPLANT
ELECT CAUTERY BLADE 6.4 (BLADE) ×2 IMPLANT
ELECT REM PT RETURN 9FT ADLT (ELECTROSURGICAL) ×2
ELECTRODE REM PT RTRN 9FT ADLT (ELECTROSURGICAL) ×1 IMPLANT
GAUZE SPONGE 4X4 12PLY STRL (GAUZE/BANDAGES/DRESSINGS) ×2 IMPLANT
GLOVE BIO SURGEON STRL SZ7.5 (GLOVE) ×2 IMPLANT
GLOVE INDICATOR 8.0 STRL GRN (GLOVE) ×2 IMPLANT
GOWN STRL REUS W/ TWL LRG LVL3 (GOWN DISPOSABLE) ×2 IMPLANT
GOWN STRL REUS W/TWL LRG LVL3 (GOWN DISPOSABLE) ×2
JACKSON PRATT 7MM (INSTRUMENTS) IMPLANT
KIT RM TURNOVER STRD PROC AR (KITS) ×2 IMPLANT
LABEL OR SOLS (LABEL) ×2 IMPLANT
NDL SAFETY 25GX1.5 (NEEDLE) ×2 IMPLANT
NS IRRIG 500ML POUR BTL (IV SOLUTION) ×2 IMPLANT
PACK BASIN MINOR ARMC (MISCELLANEOUS) ×2 IMPLANT
SPONGE DRAIN TRACH 4X4 STRL 2S (GAUZE/BANDAGES/DRESSINGS) ×2 IMPLANT
SUT ETHILON 3-0 FS-10 30 BLK (SUTURE)
SUT MNCRL 4-0 (SUTURE) ×1
SUT MNCRL 4-0 27XMFL (SUTURE) ×1
SUT VIC AB 2-0 CT2 27 (SUTURE) ×2 IMPLANT
SUT VIC AB 3-0 SH 27 (SUTURE) ×1
SUT VIC AB 3-0 SH 27X BRD (SUTURE) ×1 IMPLANT
SUTURE EHLN 3-0 FS-10 30 BLK (SUTURE) IMPLANT
SUTURE MNCRL 4-0 27XMF (SUTURE) ×1 IMPLANT
SYR BULB EAR ULCER 3OZ GRN STR (SYRINGE) ×2 IMPLANT
SYRINGE 10CC LL (SYRINGE) ×2 IMPLANT

## 2017-02-18 NOTE — Anesthesia Procedure Notes (Signed)
Date/Time: 02/18/2017 7:33 AM Performed by: Darlyne Russian Pre-anesthesia Checklist: Patient identified, Emergency Drugs available, Suction available, Patient being monitored and Timeout performed Patient Re-evaluated:Patient Re-evaluated prior to inductionOxygen Delivery Method: Nasal cannula Placement Confirmation: positive ETCO2

## 2017-02-18 NOTE — H&P (View-Only) (Signed)
Patient ID: Natasha Mitchell, female   DOB: 1966-07-18, 51 y.o.   MRN: [id number]  CC: Back Mass  HPI Geraldene Eisel Mitchell is a 51 y.o. female presents clinic for evaluation of a soft tissue mass on the right side of her back. Patient works is been there for many years but over the last several months has grown in size. It is becoming more noticeable visually and she can feel when she lays back or leans against something hard. It has increasingly been a new since and she is here today to discuss having removed. There is never been infected or drained to her knowledge. It does not hurt when she does not have pressure to it. She is otherwise in her usual state of health and denies any fevers, chills, nausea, vomiting, chest pain, shortness of breath, diarrhea, constipation.  HPI  Past Medical History:  Diagnosis Date  . Anxiety   . Depression   . Family hx of colon cancer 02/15/2016  . History of uterine fibroid    s/p abdominal hysterectomy with LSO and bilateral salpingectomy 03/2016  . Hypertension   . Intermittent palpitations   . Prediabetes   . Vein, varicose 2016    Past Surgical History:  Procedure Laterality Date  . ABLATION SAPHENOUS VEIN W/ RFA Bilateral    femoral vein ([facility])  . COLONOSCOPY WITH PROPOFOL N/A 02/11/2016   Procedure: COLONOSCOPY WITH PROPOFOL;  Surgeon: Lollie Sails, MD;  [facility];  Service: Endoscopy;  Laterality: N/A;  . HYSTERECTOMY ABDOMINAL WITH SALPINGECTOMY Bilateral 04/06/2016   Procedure: HYSTERECTOMY ABDOMINAL WITH BILATERAL SALPINGECTOMY;  Surgeon: Rubie Maid, MD;  Location: [facility] ORS;  Service: Gynecology;  Laterality: Bilateral;  . MOUTH SURGERY  2001  . TUBAL LIGATION  1998    Family History  Problem Relation Age of Onset  . Birth defects Mother     ablation in heart  . Hearing loss Father     Social History Social History  Substance Use Topics  . Smoking status: Former Smoker    Packs/day:  1.50    Years: 15.00    Types: Cigarettes    Quit date: 10/06/1999  . Smokeless tobacco: Never Used     Comment: quit in 2001  . Alcohol use 1.8 - 2.4 oz/week    1 Glasses of wine, 1 Cans of beer, 1 - 2 Standard drinks or equivalent per week     Comment: occasional beer or wine    Allergies  Allergen Reactions  . Delsym Cough Relief [Dextromethorphan-Menthol] Shortness Of Breath  . Other Technical brewer  . Latex Rash  . Motrin [Ibuprofen] Rash  . Mushroom Extract Complex Nausea Only and Palpitations    Current Outpatient Prescriptions  Medication Sig Dispense Refill  . albuterol (PROVENTIL HFA;VENTOLIN HFA) 108 (90 Base) MCG/ACT inhaler Inhale 2 puffs into the lungs every 6 (six) hours as needed for wheezing or shortness of breath. 1 Inhaler 0  . Biotin 5000 MCG CAPS Take 1 capsule by mouth daily.    . Cholecalciferol (VITAMIN D3) 1000 units CAPS Take 1 capsule by mouth daily.     Marland Kitchen glucose blood (TRUE METRIX BLOOD GLUCOSE TEST) test strip Use as instructed, check FSBS daily as desired; ICD-10 code R73.01; LON 6 months 100 each 1  . hydrochlorothiazide (HYDRODIURIL) 25 MG tablet Take 1 tablet (25 mg total) by mouth daily. 90 tablet 1  . Multiple Vitamin (MULTIVITAMIN) tablet Take 1 tablet by mouth daily.  In am    . Omega-3 Fatty Acids (FISH OIL PO) Take 1,000 mg by mouth daily. In am    . OVER THE COUNTER MEDICATION Take 1 tablet by mouth daily. blackseed    . polyethylene glycol powder (GLYCOLAX/MIRALAX) powder Take 1 Container by mouth daily. 1 scoop per day  0  . verapamil (VERELAN PM) 180 MG 24 hr capsule Take 1 capsule (180 mg total) by mouth at bedtime. 90 capsule 3  . vitamin E 400 UNIT capsule Take 400 Units by mouth daily. In am.     No current facility-administered medications for this visit.      Review of Systems A Multi-point review of systems was asked and was negative except for the findings documented in the history of present illness  Physical  Exam Blood pressure (!) 152/94, pulse 92, temperature 98.3 F (36.8 C), temperature source Oral, height 5\' 3"  (1.6 m), weight 101.6 kg (224 lb), last menstrual period 04/01/2016. CONSTITUTIONAL: No acute distress. EYES: Pupils are equal, round, and reactive to light, Sclera are non-icteric. EARS, NOSE, MOUTH AND THROAT: The oropharynx is clear. The oral mucosa is pink and moist. Hearing is intact to voice. LYMPH NODES:  Lymph nodes in the neck are normal. RESPIRATORY:  Lungs are clear. There is normal respiratory effort, with equal breath sounds bilaterally, and without pathologic use of accessory muscles. CARDIOVASCULAR: Heart is regular without murmurs, gallops, or rubs. GI: The abdomen is soft, nontender, and nondistended. There are no palpable masses. There is no hepatosplenomegaly. There are normal bowel sounds in all quadrants. GU: Rectal deferred.   MUSCULOSKELETAL: Normal muscle strength and tone. No cyanosis or edema.   SKIN: Visible and palpable soft tissue mass just the right of midline of the mid back. Measures 6 x 6 cm. NEUROLOGIC: Motor and sensation is grossly normal. Cranial nerves are grossly intact. PSYCH:  Oriented to person, place and time. Affect is normal.  Data Reviewed No recent labs or images to review I have personally reviewed the patient's imaging, laboratory findings and medical records.    Assessment    Soft tissue mass just to the right of midline in the mid back    Plan    51 year old female with a soft tissue mass on the right side of her mid back. Discussed that this is likely a lipoma. Given its size, location, symptoms discussed that we should remove this in the operating room. The procedure itself of a soft tissue mass excision was described in detail to include the risks, benefits, alternatives. Patient voiced understanding and all questions were answered satisfaction. Plan for surgical removal on Thursday, May 10.     Time spent with the patient  was 30 minutes, with more than 50% of the time spent in face-to-face education, counseling and care coordination.     Clayburn Pert, MD FACS General Surgeon 01/25/2017, 2:21 PM

## 2017-02-18 NOTE — Brief Op Note (Signed)
02/18/2017  8:36 AM  PATIENT:  Natasha Mitchell  51 y.o. female  PRE-OPERATIVE DIAGNOSIS:  lipoma on back  POST-OPERATIVE DIAGNOSIS:  lipoma on back  PROCEDURE:  Procedure(s): EXCISION LIPOMA ON MID BACK (Right)  SURGEON:  Surgeon(s) and Role:    Clayburn Pert, MD - Primary  PHYSICIAN ASSISTANT:   ASSISTANTS: none   ANESTHESIA:   local and IV sedation  EBL:  Total I/O In: 600 [I.V.:600] Out: 20 [Blood:20]  BLOOD ADMINISTERED:none  DRAINS: none   LOCAL MEDICATIONS USED:  MARCAINE   , XYLOCAINE  and Amount: 37 ml  SPECIMEN:  Source of Specimen:  mid back soft tissue mass  DISPOSITION OF SPECIMEN:  PATHOLOGY  COUNTS:  YES  TOURNIQUET:  * No tourniquets in log *  DICTATION: .Dragon Dictation  PLAN OF CARE: Discharge to home after PACU  PATIENT DISPOSITION:  PACU - hemodynamically stable.   Delay start of Pharmacological VTE agent (>24hrs) due to surgical blood loss or risk of bleeding: not applicable

## 2017-02-18 NOTE — Anesthesia Preprocedure Evaluation (Signed)
Anesthesia Evaluation  Patient identified by MRN, date of birth, ID band Patient awake    Reviewed: Allergy & Precautions, NPO status , Patient's Chart, lab work & pertinent test results  History of Anesthesia Complications Negative for: history of anesthetic complications  Airway Mallampati: II       Dental   Pulmonary neg pulmonary ROS, former smoker,           Cardiovascular hypertension, Pt. on medications + Peripheral Vascular Disease  + dysrhythmias (occasional palpatations)      Neuro/Psych Anxiety negative neurological ROS     GI/Hepatic negative GI ROS, Neg liver ROS,   Endo/Other  negative endocrine ROS  Renal/GU negative Renal ROS     Musculoskeletal   Abdominal   Peds  Hematology Anemia prior to hysterectomy   Anesthesia Other Findings   Reproductive/Obstetrics                             Anesthesia Physical Anesthesia Plan  ASA: II  Anesthesia Plan: General   Post-op Pain Management:    Induction: Intravenous  Airway Management Planned: Oral ETT  Additional Equipment:   Intra-op Plan:   Post-operative Plan:   Informed Consent: I have reviewed the patients History and Physical, chart, labs and discussed the procedure including the risks, benefits and alternatives for the proposed anesthesia with the patient or authorized representative who has indicated his/her understanding and acceptance.     Plan Discussed with:   Anesthesia Plan Comments:         Anesthesia Quick Evaluation

## 2017-02-18 NOTE — Transfer of Care (Signed)
Immediate Anesthesia Transfer of Care Note  Patient: Natasha Mitchell  Procedure(s) Performed: Procedure(s): EXCISION LIPOMA ON MID BACK (Right)  Patient Location: PACU  Anesthesia Type:General  Level of Consciousness: awake, alert  and oriented  Airway & Oxygen Therapy: Patient Spontanous Breathing and Patient connected to nasal cannula oxygen  Post-op Assessment: Report given to RN and Post -op Vital signs reviewed and stable  Post vital signs: Reviewed and stable  Last Vitals:  Vitals:   02/18/17 0614 02/18/17 0844  BP: (!) 147/79 110/65  Pulse: 76 67  Resp: 16 13  Temp: 36.8 C (!) 36 C    Last Pain:  Vitals:   02/18/17 0844  TempSrc:   PainSc: 0-No pain         Complications: No apparent anesthesia complications

## 2017-02-18 NOTE — Progress Notes (Signed)
Quarter size drainage noted and marked on dressing

## 2017-02-18 NOTE — Discharge Instructions (Signed)
Excisional Biopsy, Care After Refer to this sheet in the next few weeks. These instructions provide you with information about caring for yourself after your procedure. Your health care provider may also give you more specific instructions. Your treatment has been planned according to current medical practices, but problems sometimes occur. Call your health care provider if you have any problems or questions after your procedure. What can I expect after the procedure? After your procedure, it is common to have:  Bruising on your breast.  Numbness, tingling, or pain near your excision site. Follow these instructions at home: Medicines   Take over-the-counter and prescription medicines only as told by your health care provider.  Do not drive for 24 hours if you received a sedative.  Do not drink alcohol while taking pain medicine.  Do not drive or operate heavy machinery while taking prescription pain medicine. Biopsy Site Care    Follow instructions from your health care provider about how to take care of your incision or puncture site. Make sure you:  Wash your hands with soap and water before you change your dressing. If soap and water are not available, use hand sanitizer.  Change any bandages (dressings) once daily and as needed for saturation. Keep bacitracin along incision site until drainage stops.   Leave any stitches (sutures), skin glue, or adhesive strips in place. These skin closures may need to stay in place for 2 weeks or longer. If adhesive strip edges start to loosen and curl up, you may trim the loose edges. Do not remove adhesive strips completely unless your health care provider tells you to do that.  If you have sutures, keep them dry when bathing.  Check your incision area every day for signs of infection. Check for:  More redness, swelling, or pain.  More fluid or blood.  Warmth.  Pus or a bad smell.  Protect the biopsy area. Do not let the area get  bumped. Activity   If you had an incision during your procedure,avoid activities that may pull the incision site open. Avoid stretching, reaching, exercise, sports, or lifting anything that is heavier than 3 lb (1.4 kg) for the next 2 to 3 days.  Return to your normal activities as told by your health care provider. Ask your health care provider what activities are safe for you. General instructions   Resume your usual diet.  Keep all follow-up visits as told by your health care provider. This is important. Contact a health care provider if:  You have more redness, swelling, or pain at the biopsy site.  You have more fluid or blood coming from your biopsy site.  Your biopsy site feels warm to the touch.  You have pus or a bad smell coming from the biopsy site.  Your biopsy site breaks open after the sutures, staples, or skin adhesive strips have been removed.  You have a rash.  You have a fever. Get help right away if:  You have increased bleeding (more than a small spot) from the biopsy site.  You have difficulty breathing.  You have red streaks around the biopsy site. This information is not intended to replace advice given to you by your health care provider. Make sure you discuss any questions you have with your health care provider. Document Released: 04/10/2005 Document Revised: 05/28/2016 Document Reviewed: 06/25/2015 Elsevier Interactive Patient Education  2017 [employer].

## 2017-02-18 NOTE — Interval H&P Note (Signed)
History and Physical Interval Note:  02/18/2017 7:13 AM  Natasha Mitchell  has presented today for surgery, with the diagnosis of lipoma on back  The various methods of treatment have been discussed with the patient and family. After consideration of risks, benefits and other options for treatment, the patient has consented to  Procedure(s): EXCISION LIPOMA ON MID BACK (Right) as a surgical intervention .  The patient's history has been reviewed, patient examined, no change in status, stable for surgery.  I have reviewed the patient's chart and labs.  Questions were answered to the patient's satisfaction.     Clayburn Pert

## 2017-02-18 NOTE — Anesthesia Post-op Follow-up Note (Cosign Needed)
Anesthesia QCDR form completed.        

## 2017-02-18 NOTE — Op Note (Signed)
   Pre-operative Diagnosis: Right mid-back soft tissue mass  Post-operative Diagnosis: Same  Procedure: Excisional biopsy of right mid back soft tissue mass  Surgeon: Clayburn Pert   Assistants: None  Anesthesia: Monitored Local Anesthesia with Sedation  ASA Class: 1  Surgeon: Clayburn Pert, MD FACS  Anesthesia: Gen. with endotracheal tube  Assistant: None  Procedure Details  The patient was seen again in the Holding Room. The benefits, complications, treatment options, and expected outcomes were discussed with the patient. The risks of bleeding, infection, recurrence of symptoms, failure to resolve symptoms, any of which could require further surgery were reviewed with the patient.   The patient was taken to Operating Room, identified as Natasha Mitchell and the procedure verified.  A Time Out was held and the above information confirmed.  Prior to the induction of monitored anesthesia, VTE prophylaxis was in place. Monitored anesthesia was then administered and tolerated well. The back was prepped with Chloraprep and draped in the sterile fashion. The patient was positioned in the prone position.  The previously marked soft tissue mass was then localized with 50-50 mixture 1% lidocaine 0.5% Marcaine plain. After anesthesia was confirmed by the patient the skin was incised with a 15 blade scalpel. Using Bovie electrocautery the incision was taken down to the level of the soft tissue mass. The mass was consistent with a lipoma. It was noted have numerous attachments to the soft tissue out of the muscle. It was circumvented freed up with electrocautery ensuring meticulous hemostasis as dissection was undertaken. Numerous fingers and were found to be invading into the muscle and were meticulously freed up with electrocautery. The mass was circumferentially freed up with left cautery and removed from the field in one piece. It was passed off the field for specimen. The mass measured 8  cm in greatest diameter.  The entire area was copiously irrigated. Again hemostasis was insured. The aforementioned local anesthetic was then injected circumferentially. The deep tissues were then reapproximated with interrupted 2-0 Vicryl sutures. The skin was then closed using alternating simple interrupted and vertical mattress 3-0 nylon sutures. Bacitracin was placed over this plane dressing of gauze and tape was placed on the skin. Patient tolerated procedure well. There were no immediate, complications and all counts are correct at the end the procedure. She was awoken from anesthesia and transferred to the PACU in good condition.  Findings: 8 cm soft tissue mass   Estimated Blood Loss: 20 mL         Drains: None         Specimens: Mid back soft tissue mass          Complications: None                  Condition: Good   Clayburn Pert, MD, FACS

## 2017-02-18 NOTE — Anesthesia Postprocedure Evaluation (Signed)
Anesthesia Post Note  Patient: Natasha Mitchell  Procedure(s) Performed: Procedure(s) (LRB): EXCISION LIPOMA ON MID BACK (Right)  Patient location during evaluation: PACU Anesthesia Type: General Level of consciousness: awake and alert Pain management: pain level controlled Vital Signs Assessment: post-procedure vital signs reviewed and stable Respiratory status: spontaneous breathing and respiratory function stable Cardiovascular status: stable Anesthetic complications: no     Last Vitals:  Vitals:   02/18/17 0844 02/18/17 0859  BP: 110/65 129/69  Pulse: 67 64  Resp: 13 13  Temp: (!) 36 C     Last Pain:  Vitals:   02/18/17 0859  TempSrc:   PainSc: 0-No pain                 Kumari Sculley K

## 2017-02-19 LAB — SURGICAL PATHOLOGY

## 2017-03-03 ENCOUNTER — Ambulatory Visit (INDEPENDENT_AMBULATORY_CARE_PROVIDER_SITE_OTHER): Payer: 59 | Admitting: General Surgery

## 2017-03-03 ENCOUNTER — Encounter: Payer: Self-pay | Admitting: General Surgery

## 2017-03-03 VITALS — BP 135/80 | HR 91 | Temp 98.1°F | Ht 63.0 in | Wt 225.0 lb

## 2017-03-03 DIAGNOSIS — Z4889 Encounter for other specified surgical aftercare: Secondary | ICD-10-CM

## 2017-03-03 NOTE — Patient Instructions (Signed)
Please call our office with any questions or concerns.  Please do not submerge in a tub, hot tub, or pool until incisions are completely sealed within the next week.  Use sun block to incision area over the next year if this area will be exposed to sun. This helps decrease scarring.  You may resume your normal activities on 03/10/2017. At that time- Listen to your body when lifting, if you have pain when lifting, stop and then try again in a few days. Pain after doing exercises or activities of daily living is normal as you get back in to your normal routine.  If you develop redness, drainage, or pain at incision sites- call our office immediately and speak with a nurse.

## 2017-03-03 NOTE — Progress Notes (Signed)
Outpatient Surgical Follow Up  03/03/2017  Natasha Mitchell is an 51 y.o. female.   Chief Complaint  Patient presents with  . Routine Post Op    Excision of Lipoma Mid Back- 02/18/17 Dr. Adonis Huguenin    HPI: 51 year old female returns to clinic for follow-up 2 weeks status post right upper back lipoma excision. Patient reports doing very well. She only required one or 2 pain pills after surgery. She denies any fevers, chills, nausea, vomiting, chest pain, shortness breath, diarrhea, constipation. The area continues to heal well without any drainage. Sutures are still in place.  Past Medical History:  Diagnosis Date  . Anemia    H/O PRIOR TO HYSTERECTOMY  . Anxiety   . Arthritis   . Family hx of colon cancer 02/15/2016  . Headache   . History of uterine fibroid    s/p abdominal hysterectomy with LSO and bilateral salpingectomy 03/2016  . Hypertension   . Intermittent palpitations    SINCE TAKING VERAPAMIL AND DECREASING CAFFEINE INTAKE, PALITATIONS ARE VERY MINIMAL PER PT (02-03-17)  . Prediabetes   . Vein, varicose 2016    Past Surgical History:  Procedure Laterality Date  . ABLATION SAPHENOUS VEIN W/ RFA Bilateral    femoral vein ([facility])  . COLONOSCOPY WITH PROPOFOL N/A 02/11/2016   Procedure: COLONOSCOPY WITH PROPOFOL;  Surgeon: Lollie Sails, MD;  Location: [facility] ENDOSCOPY;  Service: Endoscopy;  Laterality: N/A;  . HYSTERECTOMY ABDOMINAL WITH SALPINGECTOMY Bilateral 04/06/2016   Procedure: HYSTERECTOMY ABDOMINAL WITH BILATERAL SALPINGECTOMY;  Surgeon: Rubie Maid, MD;  Location: [facility] ORS;  Service: Gynecology;  Laterality: Bilateral;  . LIPOMA EXCISION Right 02/18/2017   Procedure: EXCISION LIPOMA ON MID BACK;  Surgeon: Clayburn Pert, MD;  Location: [facility] ORS;  Service: General;  Laterality: Right;  . MOUTH SURGERY  2001  . TUBAL LIGATION  1998    Family History  Problem Relation Age of Onset  . Birth defects Mother        ablation in heart  .  Hearing loss Father   . Breast cancer Neg Hx     Social History:  reports that she quit smoking about 17 years ago. Her smoking use included Cigarettes. She has a 22.50 pack-year smoking history. She has never used smokeless tobacco. She reports that she drinks about 1.8 - 2.4 oz of alcohol per week . She reports that she does not use drugs.  Allergies:  Allergies  Allergen Reactions  . Delsym Cough Relief [Dextromethorphan-Menthol] Shortness Of Breath  . Other Technical brewer  . Latex Rash  . Motrin [Ibuprofen] Rash  . Mushroom Extract Complex Nausea Only and Palpitations    Medications reviewed.    ROS A multipoint review of systems was completed, all pertinent positives and negatives are documented within the history of present illness the remainder are negative   BP 135/80   Pulse 91   Temp 98.1 F (36.7 C) (Oral)   Ht 5\' 3"  (1.6 m)   Wt 102.1 kg (225 lb)   LMP 04/01/2016   BMI 39.86 kg/m   Physical Exam Gen.: No acute distress Chest: Clear to auscultation Heart: Regular rhythm Abdomen: Soft and nontender Skin: Right upper back excisional site with well approximated skin. Sutures still in place. Palpable seroma but no evidence of infection or drainage.    No results found for this or any previous visit (from the past 48 hour(s)). No results found.  Assessment/Plan:  1. Aftercare following surgery 51 year old  female status post excisional biopsy of a lipoma of the mid back. Pathology reviewed with the patient. Sutures removed without difficulty. Discussed signs and symptoms of infection and to return to clinic medially should they occur. Otherwise she'll follow up on an as-needed basis.     Clayburn Pert, MD FACS General Surgeon  03/03/2017,4:10 PM

## 2017-03-09 ENCOUNTER — Ambulatory Visit (INDEPENDENT_AMBULATORY_CARE_PROVIDER_SITE_OTHER): Payer: 59 | Admitting: Orthopedic Surgery

## 2017-03-15 DIAGNOSIS — D369 Benign neoplasm, unspecified site: Secondary | ICD-10-CM | POA: Diagnosis not present

## 2017-03-16 ENCOUNTER — Encounter: Payer: Self-pay | Admitting: Family Medicine

## 2017-03-18 ENCOUNTER — Ambulatory Visit (INDEPENDENT_AMBULATORY_CARE_PROVIDER_SITE_OTHER): Payer: 59 | Admitting: Family Medicine

## 2017-03-18 ENCOUNTER — Encounter: Payer: Self-pay | Admitting: Family Medicine

## 2017-03-18 DIAGNOSIS — D509 Iron deficiency anemia, unspecified: Secondary | ICD-10-CM

## 2017-03-18 DIAGNOSIS — Z5181 Encounter for therapeutic drug level monitoring: Secondary | ICD-10-CM | POA: Diagnosis not present

## 2017-03-18 DIAGNOSIS — I10 Essential (primary) hypertension: Secondary | ICD-10-CM | POA: Diagnosis not present

## 2017-03-18 DIAGNOSIS — R7301 Impaired fasting glucose: Secondary | ICD-10-CM

## 2017-03-18 DIAGNOSIS — E669 Obesity, unspecified: Secondary | ICD-10-CM

## 2017-03-18 MED ORDER — FLUOXETINE HCL 20 MG PO TABS: 20.0000 mg | ORAL_TABLET | Freq: Every day | ORAL | 3 refills | Status: DC

## 2017-03-18 NOTE — Assessment & Plan Note (Signed)
Discussed weight loss, victoza if diabetes, saxenda if no diabetes; she does not want metformin; discussed prozac and topamax; she'll read about options; water, movement, portions

## 2017-03-18 NOTE — Patient Instructions (Addendum)
  Read about Kirke Shaggy (if you do not have diabetes) or Victoza (if you do have diabetes) Other drugs are topamax (anti-seizure medicine, off label) and Contrave Let's get labs tomorrow or the next week FASTING Check out the information at familydoctor.org entitled "Nutrition for Weight Loss: What You Need to Know about Fad Diets" Try to lose between 1-2 pounds per week by taking in fewer calories and burning off more calories You can succeed by limiting portions, limiting foods dense in calories and fat, becoming more active, and drinking 8 glasses of water a day (64 ounces) Don't skip meals, especially breakfast, as skipping meals may alter your metabolism Do not use over-the-counter weight loss pills or gimmicks that claim rapid weight loss A healthy BMI (or body mass index) is between 18.5 and 24.9 You can calculate your ideal BMI at the [place] website ClubMonet[employer].fr

## 2017-03-18 NOTE — Progress Notes (Signed)
BP 132/84   Pulse 84   Temp 98.2 F (36.8 C) (Oral)   Resp 16   Ht '5\' 4"'$  (1.626 m)   Wt 219 lb 1.6 oz (99.4 kg)   LMP 04/01/2016   SpO2 97%   BMI 37.61 kg/m    Subjective:    Patient ID: Natasha Mitchell, female    DOB: 1965/10/07, 51 y.o.   MRN: [id number]  HPI: [place] is a 51 y.o. female  Chief Complaint  Patient presents with  . Obesity    would like weight loss med    HPI Patient is here to discuss weight loss; since last visit, she got through the lipoma excision, had extensions, path benign (reviewed) Colonoscopy too She has anemia; inherited, small pale cells Prediabetes Lab Results  Component Value Date   HGBA1C 6.5 (H) 03/19/2017  no dry mouth, no blurred vision Checking FSBS, staying 122-124 and under Checking randoms, 148 Trying to eat better  Would like to talk about weight loss medicine Does not have as much oomph Hx of vitamin D deficiency No hx of thyroid disease Lab Results  Component Value Date   TSH 0.73 03/19/2017  weight 189 pounds two years; creeping up Patient would prefer to avoid metformin; not interested No hx of MTC No MEN-2 Does drink plenty of water. 50-70 ounces of water; not a soda drinker, coke zero sometimes Discussed eating habits; snacking less, used to get up at night Went through surgical menopause Stopped celexa 2.5 months ago; bumped it to 30 mg daily; mostly social anxiety, grandbabies, not depressed, maybe hormonal; missing that calm internally; did have great improvement first 6 months on the celexa  Depression screen [facility] 2/9 03/18/2017 08/10/2016 02/18/2016 01/21/2016 06/18/2015  Decreased Interest 0 0 0 0 0  Down, Depressed, Hopeless 0 0 0 0 1  PHQ - 2 Score 0 0 0 0 1    Relevant past medical, surgical, family and social history reviewed Past Medical History:  Diagnosis Date  . Anemia    H/O PRIOR TO HYSTERECTOMY  . Anxiety   . Arthritis   . Family hx of colon cancer 02/15/2016  . Headache     . History of uterine fibroid    s/p abdominal hysterectomy with LSO and bilateral salpingectomy 03/2016  . Hypertension   . Intermittent palpitations    SINCE TAKING VERAPAMIL AND DECREASING CAFFEINE INTAKE, PALITATIONS ARE VERY MINIMAL PER PT (02-03-17)  . Prediabetes   . Vein, varicose 2016   Past Surgical History:  Procedure Laterality Date  . ABLATION SAPHENOUS VEIN W/ RFA Bilateral    femoral vein ([facility])  . COLONOSCOPY WITH PROPOFOL N/A 02/11/2016   Procedure: COLONOSCOPY WITH PROPOFOL;  Surgeon: Lollie Sails, MD;  Location[facility] ENDOSCOPY;  Service: Endoscopy;  Laterality: N/A;  . HYSTERECTOMY ABDOMINAL WITH SALPINGECTOMY Bilateral 04/06/2016   Procedure: HYSTERECTOMY ABDOMINAL WITH BILATERAL SALPINGECTOMY;  Surgeon: Rubie Maid, MD;  Location: [facility] ORS;  Service: Gynecology;  Laterality: Bilateral;  . LIPOMA EXCISION Right 02/18/2017   Procedure: EXCISION LIPOMA ON MID BACK;  Surgeon: Clayburn Pert, MD;  Location: [facility] ORS;  Service: General;  Laterality: Right;  . MOUTH SURGERY  2001  . TUBAL LIGATION  1998   Family History  Problem Relation Age of Onset  . Birth defects Mother        ablation in heart  . Hearing loss Father   . Breast cancer Neg Hx    Social History   Social  History  . Marital status: Single    Spouse name: N/A  . Number of children: N/A  . Years of education: N/A   Occupational History  . Not on file.   Social History Main Topics  . Smoking status: Former Smoker    Packs/day: 1.50    Years: 15.00    Types: Cigarettes    Quit date: 10/06/1999  . Smokeless tobacco: Never Used     Comment: quit in 2001  . Alcohol use 1.8 - 2.4 oz/week    1 Glasses of wine, 1 Cans of beer, 1 - 2 Standard drinks or equivalent per week     Comment: occasional beer or wine  . Drug use: No  . Sexual activity: No   Other Topics Concern  . Not on file   Social History Narrative  . No narrative on file    Interim medical history  since last visit reviewed. Allergies and medications reviewed  Review of Systems Per HPI unless specifically indicated above     Objective:    BP 132/84   Pulse 84   Temp 98.2 F (36.8 C) (Oral)   Resp 16   Ht '5\' 4"'$  (1.626 m)   Wt 219 lb 1.6 oz (99.4 kg)   LMP 04/01/2016   SpO2 97%   BMI 37.61 kg/m   Wt Readings from Last 3 Encounters:  03/18/17 219 lb 1.6 oz (99.4 kg)  03/03/17 225 lb (102.1 kg)  02/18/17 224 lb (101.6 kg)  MD note, weight two years ago: about 189 pounds  Physical Exam  Constitutional: She appears well-developed and well-nourished.  Obese, weight down nearly 6 pounds over last 2 weeks  HENT:  Mouth/Throat: Mucous membranes are normal.  Eyes: EOM are normal. No scleral icterus.  Cardiovascular: Normal rate and regular rhythm.   Pulmonary/Chest: Effort normal and breath sounds normal.  Skin:  Surgical scar mid back, no erythema  Psychiatric: She has a normal mood and affect. Her behavior is normal.    Results for orders placed or performed in visit on 03/18/17  Comprehensive metabolic panel  Result Value Ref Range   Sodium 138 135 - 146 mmol/L   Potassium 4.2 3.5 - 5.3 mmol/L   Chloride 101 98 - 110 mmol/L   CO2 28 20 - 31 mmol/L   Glucose, Bld 117 (H) 65 - 99 mg/dL   BUN 17 7 - 25 mg/dL   Creat 0.81 0.50 - 1.05 mg/dL   Total Bilirubin 0.6 0.2 - 1.2 mg/dL   Alkaline Phosphatase 121 33 - 130 U/L   AST 20 10 - 35 U/L   ALT 16 6 - 29 U/L   Total Protein 6.8 6.1 - 8.1 g/dL   Albumin 4.0 3.6 - 5.1 g/dL   Calcium 9.6 8.6 - 10.4 mg/dL  CBC with Differential/Platelet  Result Value Ref Range   WBC 4.2 3.8 - 10.8 K/uL   RBC 5.38 (H) 3.80 - 5.10 MIL/uL   Hemoglobin 12.1 11.7 - 15.5 g/dL   HCT 38.4 35.0 - 45.0 %   MCV 71.4 (L) 80.0 - 100.0 fL   MCH 22.5 (L) 27.0 - 33.0 pg   MCHC 31.5 (L) 32.0 - 36.0 g/dL   RDW 17.1 (H) 11.0 - 15.0 %   Platelets 385 140 - 400 K/uL   MPV 9.1 7.5 - 12.5 fL   Neutro Abs 1,596 1,500 - 7,800 cells/uL   Lymphs Abs  1,932 850 - 3,900 cells/uL   Monocytes Absolute 420 200 -  950 cells/uL   Eosinophils Absolute 210 15 - 500 cells/uL   Basophils Absolute 42 0 - 200 cells/uL   Neutrophils Relative % 38 %   Lymphocytes Relative 46 %   Monocytes Relative 10 %   Eosinophils Relative 5 %   Basophils Relative 1 %   Smear Review Criteria for review not met   Lipid panel  Result Value Ref Range   Cholesterol 237 (H) <200 mg/dL   Triglycerides 122 <150 mg/dL   HDL 53 >50 mg/dL   Total CHOL/HDL Ratio 4.5 <5.0 Ratio   VLDL 24 <30 mg/dL   LDL Cholesterol 160 (H) <100 mg/dL  Hemoglobin A1c  Result Value Ref Range   Hgb A1c MFr Bld 6.5 (H) <5.7 %   Mean Plasma Glucose 140 mg/dL  TSH  Result Value Ref Range   TSH 0.73 mIU/L  Ferritin  Result Value Ref Range   Ferritin 39 10 - 232 ng/mL      Assessment & Plan:   Problem List Items Addressed This Visit      Cardiovascular and Mediastinum   Hypertension goal BP (blood pressure) < 140/90    controlled      Relevant Orders   Lipid panel (Completed)     Endocrine   IFG (impaired fasting glucose)    Check glucose and A1c; glad she is working on healthy eating and weight loss      Relevant Orders   Comprehensive metabolic panel (Completed)   Lipid panel (Completed)   Hemoglobin A1c (Completed)     Other   Obesity (BMI 35.0-39.9 without comorbidity)    Discussed weight loss, victoza if diabetes, saxenda if no diabetes; she does not want metformin; discussed prozac and topamax; she'll read about options; water, movement, portions      Relevant Orders   Lipid panel (Completed)   TSH (Completed)   Medication monitoring encounter   Iron deficiency anemia    Check ferritin and CBC, but likely inherited microcytosis      Relevant Orders   CBC with Differential/Platelet (Completed)   Ferritin (Completed)       Follow up plan: No Follow-up on file.  An after-visit summary was printed and given to the patient at [place].  Please see the  patient instructions which may contain other information and recommendations beyond what is mentioned above in the assessment and plan.  Meds ordered this encounter  Medications  . FLUoxetine (PROZAC) 20 MG tablet    Sig: Take 1 tablet (20 mg total) by mouth daily.    Dispense:  30 tablet    Refill:  3    Orders Placed This Encounter  Procedures  . Comprehensive metabolic panel  . CBC with Differential/Platelet  . Lipid panel  . Hemoglobin A1c  . TSH  . Ferritin

## 2017-03-18 NOTE — Assessment & Plan Note (Signed)
Check ferritin and CBC, but likely inherited microcytosis

## 2017-03-18 NOTE — Assessment & Plan Note (Signed)
Check glucose and A1c; glad she is working on healthy eating and weight loss

## 2017-03-18 NOTE — Assessment & Plan Note (Signed)
controlled 

## 2017-03-19 DIAGNOSIS — E669 Obesity, unspecified: Secondary | ICD-10-CM | POA: Diagnosis not present

## 2017-03-19 DIAGNOSIS — D509 Iron deficiency anemia, unspecified: Secondary | ICD-10-CM | POA: Diagnosis not present

## 2017-03-19 DIAGNOSIS — I10 Essential (primary) hypertension: Secondary | ICD-10-CM | POA: Diagnosis not present

## 2017-03-19 DIAGNOSIS — R7301 Impaired fasting glucose: Secondary | ICD-10-CM | POA: Diagnosis not present

## 2017-03-19 LAB — CBC WITH DIFFERENTIAL/PLATELET
BASOS PCT: 1 %
Basophils Absolute: 42 cells/uL (ref 0–200)
EOS ABS: 210 {cells}/uL (ref 15–500)
Eosinophils Relative: 5 %
HEMATOCRIT: 38.4 % (ref 35.0–45.0)
HEMOGLOBIN: 12.1 g/dL (ref 11.7–15.5)
LYMPHS ABS: 1932 {cells}/uL (ref 850–3900)
LYMPHS PCT: 46 %
MCH: 22.5 pg — ABNORMAL LOW (ref 27.0–33.0)
MCHC: 31.5 g/dL — ABNORMAL LOW (ref 32.0–36.0)
MCV: 71.4 fL — AB (ref 80.0–100.0)
MONO ABS: 420 {cells}/uL (ref 200–950)
MPV: 9.1 fL (ref 7.5–12.5)
Monocytes Relative: 10 %
NEUTROS PCT: 38 %
Neutro Abs: 1596 cells/uL (ref 1500–7800)
Platelets: 385 10*3/uL (ref 140–400)
RBC: 5.38 MIL/uL — ABNORMAL HIGH (ref 3.80–5.10)
RDW: 17.1 % — AB (ref 11.0–15.0)
WBC: 4.2 10*3/uL (ref 3.8–10.8)

## 2017-03-19 LAB — COMPREHENSIVE METABOLIC PANEL
ALT: 16 U/L (ref 6–29)
AST: 20 U/L (ref 10–35)
Albumin: 4 g/dL (ref 3.6–5.1)
Alkaline Phosphatase: 121 U/L (ref 33–130)
BUN: 17 mg/dL (ref 7–25)
CO2: 28 mmol/L (ref 20–31)
CREATININE: 0.81 mg/dL (ref 0.50–1.05)
Calcium: 9.6 mg/dL (ref 8.6–10.4)
Chloride: 101 mmol/L (ref 98–110)
Glucose, Bld: 117 mg/dL — ABNORMAL HIGH (ref 65–99)
POTASSIUM: 4.2 mmol/L (ref 3.5–5.3)
SODIUM: 138 mmol/L (ref 135–146)
Total Bilirubin: 0.6 mg/dL (ref 0.2–1.2)
Total Protein: 6.8 g/dL (ref 6.1–8.1)

## 2017-03-19 LAB — LIPID PANEL
CHOL/HDL RATIO: 4.5 ratio (ref <5.0)
Cholesterol: 237 mg/dL — ABNORMAL HIGH (ref <200)
HDL: 53 mg/dL (ref >50)
LDL CALC: 160 mg/dL — AB (ref <100)
TRIGLYCERIDES: 122 mg/dL (ref <150)
VLDL: 24 mg/dL (ref <30)

## 2017-03-19 LAB — TSH: TSH: 0.73 mIU/L

## 2017-03-20 LAB — HEMOGLOBIN A1C
HEMOGLOBIN A1C: 6.5 % — AB (ref <5.7)
Mean Plasma Glucose: 140 mg/dL

## 2017-03-20 LAB — FERRITIN: FERRITIN: 39 ng/mL (ref 10–232)

## 2017-04-06 ENCOUNTER — Encounter: Payer: Self-pay | Admitting: Obstetrics and Gynecology

## 2017-04-15 ENCOUNTER — Ambulatory Visit (INDEPENDENT_AMBULATORY_CARE_PROVIDER_SITE_OTHER): Payer: 59 | Admitting: Podiatry

## 2017-04-15 ENCOUNTER — Encounter: Payer: Self-pay | Admitting: Family Medicine

## 2017-04-15 ENCOUNTER — Ambulatory Visit (INDEPENDENT_AMBULATORY_CARE_PROVIDER_SITE_OTHER): Payer: 59

## 2017-04-15 ENCOUNTER — Encounter: Payer: Self-pay | Admitting: Podiatry

## 2017-04-15 ENCOUNTER — Telehealth: Payer: Self-pay

## 2017-04-15 ENCOUNTER — Telehealth: Payer: Self-pay | Admitting: Family Medicine

## 2017-04-15 VITALS — BP 165/104 | HR 86

## 2017-04-15 DIAGNOSIS — M2140 Flat foot [pes planus] (acquired), unspecified foot: Secondary | ICD-10-CM

## 2017-04-15 DIAGNOSIS — M76829 Posterior tibial tendinitis, unspecified leg: Secondary | ICD-10-CM

## 2017-04-15 DIAGNOSIS — R52 Pain, unspecified: Secondary | ICD-10-CM

## 2017-04-15 MED ORDER — MELOXICAM 15 MG PO TABS: 15.0000 mg | ORAL_TABLET | Freq: Every day | ORAL | 0 refills | Status: DC

## 2017-04-15 MED ORDER — VERAPAMIL HCL ER 240 MG PO CP24: 240.0000 mg | ORAL_CAPSULE | Freq: Every day | ORAL | 5 refills | Status: DC

## 2017-04-15 NOTE — Telephone Encounter (Signed)
Patient does not want 3rd med at this time, agrees to increase CCB and check pulse and BP; call me if not controlled; limit salt, limit stress, etc. Tylenol won't increase BP; NSAIDs can

## 2017-04-15 NOTE — Telephone Encounter (Signed)
Patient called back about BP she states may have to have surgery and is in a lot of pain, she also did not take BP med before the appt.  Please advise

## 2017-04-15 NOTE — Telephone Encounter (Signed)
I received note from podiatrist Patient's blood ressure was elevated Please ask her to come by today for BP check when you can see her, BP and pulse since she's on the CCB Verify she's taking her BP meds (verapamil and HCTZ)

## 2017-04-15 NOTE — Progress Notes (Signed)
This patient presents to the office with chief complaint of severely painful left rear foot.  She says that her left rear foot has become swollen and severely painful.  She says that she works and stands and this is aggravating her condition.  She presents the office with an antalgic gait.  She says that she has previously been diagnosed with tendinitis of the left ankle and treated with orthoses.    She says this foot has been increasingly painful in the last few weeks.  She says she has taken Motrin by mouth to help to control the pain, but the problem persists.  She says the pain has gotten unbearable and she now presents the office today for continued evaluation and treatment of her painful left foot.  GENERAL APPEARANCE: Alert, conversant. Appropriately groomed. No acute distress.  VASCULAR: Pedal pulses are  palpable at  [facility] and PT bilateral.  Capillary refill time is immediate to all digits,  Normal temperature gradient.  Digital hair growth is present bilateral  NEUROLOGIC: sensation is normal to 5.07 monofilament at 5/5 sites bilateral.  Light touch is intact bilateral, Muscle strength normal.  MUSCULOSKELETAL: acceptable muscle strength, tone and stability bilateral.  Hammer toes/mallet toes  B/L.  there is a significant swelling and pain at the talonavicular joint medially on the left foot.  There is pain along the course of the posterior tibial tendon, left foot.  She has no pain upon dorsiflexion and plantarflexion of her foot.  Patient has marked weakness on the inversion of the left foot.  Her swelling is approximately 3+ over the talonavicular joint, left foot.  DERMATOLOGIC: skin color, texture, and turgor are within normal limits.  No preulcerative lesions or ulcers  are seen, no interdigital maceration noted.  No open lesions present.  Digital nails are asymptomatic. No drainage noted.  Diagnosis  PTTD dysfunction.     ROV.  X-rays taken and do reveal a gorriloid navicular in addition  to a cystic formation noted to the navicular of the left foot.  Talus is plantarflexed.  Calcification at the insertion of plantar fascia left foot.  Degenerative changes noted at the talonavicular joint.  Patient was diagnosed with an acute posterior tibial tendon dysfunction, left foot.  Patient was prescribed Mobic for her swelling and pain.  She was also immobilized with an Haematologist and a surgical shoe.  This patient needs to be scheduled for an MRI of her left foot.  Patient to return the office in one week at which time the [facility] boot will be removed and we will reevaluate her foot.  Patient should be scheduled with Dr. Milinda Pointer for workup on the posterior tibial tendon dysfunction in future.  She is to return in 1 week for unna boot removal.  RTC 1 week.   Gardiner Barefoot DPM

## 2017-04-15 NOTE — Telephone Encounter (Signed)
Left detailed vociemail 

## 2017-04-19 ENCOUNTER — Ambulatory Visit: Payer: 59 | Admitting: Podiatry

## 2017-04-22 ENCOUNTER — Ambulatory Visit
Admission: RE | Admit: 2017-04-22 | Discharge: 2017-04-22 | Disposition: A | Discharge status: Home / Self Care | Payer: 59 | Source: Ambulatory Visit | Attending: Podiatry | Admitting: Podiatry

## 2017-04-22 ENCOUNTER — Encounter: Payer: Self-pay | Admitting: Podiatry

## 2017-04-22 ENCOUNTER — Ambulatory Visit (INDEPENDENT_AMBULATORY_CARE_PROVIDER_SITE_OTHER): Payer: 59 | Admitting: Podiatry

## 2017-04-22 ENCOUNTER — Ambulatory Visit: Payer: 59 | Admitting: Podiatry

## 2017-04-22 DIAGNOSIS — M76829 Posterior tibial tendinitis, unspecified leg: Secondary | ICD-10-CM

## 2017-04-22 DIAGNOSIS — R52 Pain, unspecified: Secondary | ICD-10-CM

## 2017-04-22 DIAGNOSIS — M19072 Primary osteoarthritis, left ankle and foot: Secondary | ICD-10-CM | POA: Diagnosis not present

## 2017-04-22 DIAGNOSIS — M722 Plantar fascial fibromatosis: Secondary | ICD-10-CM | POA: Diagnosis not present

## 2017-04-22 DIAGNOSIS — M2140 Flat foot [pes planus] (acquired), unspecified foot: Secondary | ICD-10-CM

## 2017-04-22 NOTE — Progress Notes (Signed)
This patient returns to the office follow-up for diagnosis of a posterior tibial tendon dysfunction on her left foot.  She presented to the office on 712 and was diagnosed with an acute tendinitis on the left foot.  There was significant pain and discomfort at the inside of her left foot below her ankle.  She was diagnosed with a posterior tibial tendon dysfunction.  She was treated with an Haematologist and a surgical shoe and prescribed Mobic.  She presents the office today for follow-up care.  She states that she went to work and despite wearing the [employer] was then pain.  Therefore, she said that she was unable to work until this problem was resolved.       Podiatric Exam: Vascular: dorsalis pedis and posterior tibial pulses are palpable bilateral. Capillary return is immediate. Temperature gradient is WNL. Skin turgor WNL  Sensorium: Normal Semmes Weinstein monofilament test. Normal tactile sensation bilaterally. Nail Exam: Pt has thick disfigured discolored nails with subungual debris noted bilateral entire nail hallux through fifth toenails Ulcer Exam: There is no evidence of ulcer or pre-ulcerative changes or infection. Orthopedic Exam: Muscle tone and strength are WNL. No limitations in general ROM. No crepitus or effusions noted. Foot type and digits show no abnormalities. Bony prominences are unremarkable. There is marked reduction in pain at the talonavicular area of the left foot after the removal of the Unna boot. Skin: No Porokeratosis. No infection or ulcers  Diagnosis  PTTD left foot   ROV.  The Unna boot that previously was dispensed was removed.  The site of the swelling was resolved.  Patient was given an anklet to be worn on her left ankle.  She was also prescribed a long-legged  Cam Walker to be worn.   She is scheduled to return in 2 weeks with Dr. Milinda Pointer for a reading and interpretation of the MRI results.  Discussion with this patient concerning her work status was done.  It was  decided for her to remain off of work for 4 weeks until the complete process of treatment and discussion of the MRI was performed.  Patient to return to the office when necessary   Gardiner Barefoot DPM

## 2017-04-26 ENCOUNTER — Ambulatory Visit: Payer: 59 | Admitting: Podiatry

## 2017-04-26 ENCOUNTER — Telehealth: Payer: Self-pay | Admitting: *Deleted

## 2017-04-26 ENCOUNTER — Telehealth: Payer: Self-pay | Admitting: Podiatry

## 2017-04-26 DIAGNOSIS — M2140 Flat foot [pes planus] (acquired), unspecified foot: Secondary | ICD-10-CM

## 2017-04-26 NOTE — Telephone Encounter (Signed)
I have been seeing Dr. Prudence Davidson. I saw him just last week and was given a boot to wear. The cast does better for me, I feel more stable in it. In the boot I had a lot of swelling on Friday and Saturday. I was told I could call and let him know if I wanted to go back into the wet cast. Please call me back at [phone]. Thank you.

## 2017-04-26 NOTE — Telephone Encounter (Addendum)
-----   Message from Garrel Ridgel, [place] sent at 04/26/2017  7:07 AM EDT ----- Please send for an over read and inform patient of the delay.  I informed pt of Dr. Stephenie Acres orders and explaining the MRI would be sent to a radiology specialist to review the minute details to assist Dr. Milinda Pointer in determining treatment. I asked pt if she wanted to cancel the 05/10/2017 appt, and pt stated yes, but she was having trouble with swelling and the boot. I told the pt she should decrease her weight bearing time and wear the boot as much as possible to cutdown on the movement and continue irritations of the structures of her foot. Pt states Dr. Prudence Davidson said she could come in for another soft cast, and I told her I would have the schedulers call to scheduler on the nurses schedule.

## 2017-04-27 ENCOUNTER — Telehealth: Payer: Self-pay | Admitting: Podiatry

## 2017-04-27 DIAGNOSIS — M2042 Other hammer toe(s) (acquired), left foot: Secondary | ICD-10-CM

## 2017-04-27 NOTE — Telephone Encounter (Signed)
Left VM for pt to call me about disability paperwork

## 2017-05-06 ENCOUNTER — Encounter: Payer: Self-pay | Admitting: *Deleted

## 2017-05-07 ENCOUNTER — Encounter: Payer: Self-pay | Admitting: *Deleted

## 2017-05-07 ENCOUNTER — Encounter
Admission: RE | Disposition: A | Discharge status: Home / Self Care | Payer: Self-pay | Source: Ambulatory Visit | Attending: Gastroenterology

## 2017-05-07 ENCOUNTER — Ambulatory Visit
Admission: RE | Admit: 2017-05-07 | Discharge: 2017-05-07 | Disposition: A | Discharge status: Home / Self Care | Payer: 59 | Source: Ambulatory Visit | Attending: Gastroenterology | Admitting: Gastroenterology

## 2017-05-07 ENCOUNTER — Encounter: Payer: Self-pay | Admitting: Anesthesiology

## 2017-05-07 DIAGNOSIS — K635 Polyp of colon: Secondary | ICD-10-CM | POA: Insufficient documentation

## 2017-05-07 DIAGNOSIS — Z800 Family history of malignant neoplasm of digestive organs: Secondary | ICD-10-CM | POA: Diagnosis not present

## 2017-05-07 DIAGNOSIS — Z79899 Other long term (current) drug therapy: Secondary | ICD-10-CM | POA: Insufficient documentation

## 2017-05-07 DIAGNOSIS — I10 Essential (primary) hypertension: Secondary | ICD-10-CM | POA: Diagnosis not present

## 2017-05-07 DIAGNOSIS — Z09 Encounter for follow-up examination after completed treatment for conditions other than malignant neoplasm: Secondary | ICD-10-CM | POA: Diagnosis not present

## 2017-05-07 DIAGNOSIS — Z7901 Long term (current) use of anticoagulants: Secondary | ICD-10-CM | POA: Insufficient documentation

## 2017-05-07 DIAGNOSIS — Z7982 Long term (current) use of aspirin: Secondary | ICD-10-CM | POA: Insufficient documentation

## 2017-05-07 DIAGNOSIS — Z86010 Personal history of colonic polyps: Secondary | ICD-10-CM | POA: Diagnosis not present

## 2017-05-07 DIAGNOSIS — D127 Benign neoplasm of rectosigmoid junction: Secondary | ICD-10-CM | POA: Diagnosis not present

## 2017-05-07 HISTORY — PX: FLEXIBLE SIGMOIDOSCOPY: SHX5431

## 2017-05-07 LAB — GLUCOSE, CAPILLARY: GLUCOSE-CAPILLARY: 114 mg/dL — AB (ref 65–99)

## 2017-05-07 LAB — HM COLONOSCOPY

## 2017-05-07 SURGERY — SIGMOIDOSCOPY, FLEXIBLE
Anesthesia: Moderate Sedation

## 2017-05-07 MED ORDER — MIDAZOLAM HCL 5 MG/5ML IJ SOLN
INTRAMUSCULAR | Status: AC
  Filled 2017-05-07: qty 5

## 2017-05-07 MED ORDER — FENTANYL CITRATE (PF) 100 MCG/2ML IJ SOLN
INTRAMUSCULAR | Status: AC
  Filled 2017-05-07: qty 2

## 2017-05-07 MED ORDER — MIDAZOLAM HCL 5 MG/5ML IJ SOLN
INTRAMUSCULAR | Status: DC | PRN
  Administered 2017-05-07 (×5): 1 mg via INTRAVENOUS
  Administered 2017-05-07: 2 mg via INTRAVENOUS

## 2017-05-07 MED ORDER — FENTANYL CITRATE (PF) 100 MCG/2ML IJ SOLN
INTRAMUSCULAR | Status: DC | PRN
  Administered 2017-05-07 (×4): 25 ug via INTRAVENOUS

## 2017-05-07 MED ORDER — SODIUM CHLORIDE 0.9 % IV SOLN
INTRAVENOUS | Status: DC
  Administered 2017-05-07: 1000 mL via INTRAVENOUS

## 2017-05-07 MED ORDER — SODIUM CHLORIDE 0.9 % IV SOLN: INTRAVENOUS | Status: DC

## 2017-05-07 NOTE — Op Note (Signed)
[facility] Gastroenterology Patient Name: Natasha Mitchell Procedure Date: 05/07/2017 1:41 PM MRN: [id number] Account #: [id number] Date of Birth: Apr 27, 1966 Admit Type: Outpatient Age: 51 Room: [facility] ENDO ROOM 1 Gender: Female Note Status: Finalized Procedure:            Flexible Sigmoidoscopy Indications:          Personal history of colonic polyps, Follow up                        polypectomy of polyp with focal high grade dysplasia. Providers:            Lollie Sails, MD Referring MD:         Arnetha Courser (Referring MD) Medicines:            Fentanyl 100 micrograms IV, Midazolam 7 mg IV Complications:        No immediate complications. Procedure:            Pre-Anesthesia Assessment:                       - ASA Grade Assessment: II - A patient with mild                        systemic disease.                       After obtaining informed consent, the scope was passed                        under direct vision. The Colonoscope was introduced                        through the anus and advanced to the the splenic                        flexure. The flexible sigmoidoscopy was accomplished                        without difficulty. The patient tolerated the procedure                        well. The quality of the bowel preparation was good. Findings:      The digital rectal exam was normal.      Two sessile polyps were found in the recto-sigmoid colon. The polyps       were less than 1 mm in size. These polyps were removed with a cold       biopsy forceps. Resection and retrieval were complete.      The exam was otherwise normal throughout the examined colon to the       splenic flexure, with no evidence of previous polyp or recurrence. Impression:           - Two less than 1 mm polyps at the recto-sigmoid colon,                        removed with a cold biopsy forceps. Resected and                        retrieved. Recommendation:       - Advance  diet as tolerated.                       -  Perform a colonoscopy in 2 years. Procedure Code(s):    --- Professional ---                       [phone], Sigmoidoscopy, flexible; with biopsy, single or                        multiple Diagnosis Code(s):    --- Professional ---                       D12.7, Benign neoplasm of rectosigmoid junction                       Z86.010, Personal history of colonic polyps CPT copyright 2016 American Medical Association. All rights reserved. The codes documented in this report are preliminary and upon coder review may  be revised to meet current compliance requirements. Lollie Sails, MD 05/07/2017 2:44:08 PM This report has been signed electronically. Number of Addenda: 0 Note Initiated On: 05/07/2017 1:41 PM      [facility]

## 2017-05-07 NOTE — H&P (Signed)
Outpatient short stay form Pre-procedure 05/07/2017 1:55 PM Natasha Sails MD  Primary Physician: Dr. Bobetta Lime  Reason for visit:  Sedated flexible sigmoidoscopy  History of present illness:  Patient is a 51 year old female presenting today as above. She has a personal history of a colon polyp having been removed on 02/11/2016. This was in the mid sigmoid colon region. As was removed with cautery. This polyp was found to have a area of high-grade dysplasia in it. There was some nonbleeding dysplastic mucosa seen at the base however a small piece of the base that was removed after the main polyp showed no evidence of this. She is returning for a look at this particular site. She tolerated her prep well. She takes no aspirin or blood thinning agents with the exception of occasional Excedrin. She has taken none of these recently.    Current Facility-Administered Medications:  .  0.9 %  sodium chloride infusion, , Intravenous, Continuous, Natasha Sails, MD, Last Rate: 20 mL/hr at 05/07/17 1314, 1,000 mL at 05/07/17 1314 .  0.9 %  sodium chloride infusion, , Intravenous, Continuous, Natasha Sails, MD  Prescriptions Prior to Admission  Medication Sig Dispense Refill Last Dose  . Biotin 5000 MCG CAPS Take 1 capsule by mouth daily.   05/06/2017 at Unknown time  . Cholecalciferol (VITAMIN D3 ADULT GUMMIES PO) Take 2 tablets by mouth daily. [name]   05/06/2017 at Unknown time  . FLUoxetine (PROZAC) 20 MG tablet Take 1 tablet (20 mg total) by mouth daily. 30 tablet 3 05/06/2017 at Unknown time  . glucose blood (TRUE METRIX BLOOD GLUCOSE TEST) test strip Use as instructed, check FSBS daily as desired; ICD-10 code R73.01; LON 6 months 100 each 1 05/06/2017 at Unknown time  . hydrochlorothiazide (HYDRODIURIL) 25 MG tablet Take 1 tablet (25 mg total) by mouth daily. 90 tablet 1 05/06/2017 at Unknown time  . meloxicam (MOBIC) 15 MG tablet Take 1 tablet (15 mg total) by mouth daily. 30  tablet 0 05/06/2017 at Unknown time  . Multiple Vitamin (MULTIVITAMIN) tablet Take 1 tablet by mouth daily.    05/06/2017 at Unknown time  . Omega-3 Fatty Acids (FISH OIL PO) Take 1,000 mg by mouth daily.    05/06/2017 at Unknown time  . polyethylene glycol (MIRALAX / GLYCOLAX) packet Take 17 g by mouth daily as needed for mild constipation.    05/06/2017 at Unknown time  . tetrahydrozoline-zinc (VISINE-AC) 0.05-0.25 % ophthalmic solution Place 3-4 drops into both eyes 2 (two) times daily. Depends on irritation if 3-4 drops   05/06/2017 at Unknown time  . verapamil (VERELAN PM) 240 MG 24 hr capsule Take 1 capsule (240 mg total) by mouth at bedtime. 30 capsule 5 05/07/2017 at Unknown time  . vitamin E 400 UNIT capsule Take 400 Units by mouth daily.    05/06/2017 at Unknown time  . Aspirin-Acetaminophen-Caffeine (EXCEDRIN PO) Take 2 tablets by mouth daily as needed (PAIN).   Taking     Allergies  Allergen Reactions  . Delsym Cough Relief [Dextromethorphan-Menthol] Shortness Of Breath  . Other Technical brewer  . Latex Rash  . Motrin [Ibuprofen] Rash  . Mushroom Extract Complex Nausea Only and Palpitations  . Sulfa Antibiotics Nausea Only and Palpitations     Past Medical History:  Diagnosis Date  . Anemia    H/O PRIOR TO HYSTERECTOMY  . Anxiety   . Arthritis   . Family hx of colon cancer 02/15/2016  .  Headache   . History of uterine fibroid    s/p abdominal hysterectomy with LSO and bilateral salpingectomy 03/2016  . Hypertension   . Intermittent palpitations    SINCE TAKING VERAPAMIL AND DECREASING CAFFEINE INTAKE, PALITATIONS ARE VERY MINIMAL PER PT (02-03-17)  . Prediabetes   . Vein, varicose 2016    Review of systems:      Physical Exam    Heart and lungs: Regular rate and rhythm without rub or gallop, lungs are bilaterally clear.    HEENT: Normocephalic atraumatic eyes are anicteric    Other:     Pertinant exam for procedure: Soft nontender nondistended bowel sounds  positive normoactive.    Planned proceedures: Sedated flexible sigmoidoscopy. I discussed the risks benefits complications to include not limited to bleeding infection perforation and the risk of sedation and she wishes to proceed. I have discussed the risks benefits and complications of procedures to include not limited to bleeding, infection, perforation and the risk of sedation and the patient wishes to proceed.    Natasha Sails, MD Gastroenterology 05/07/2017  1:55 PM

## 2017-05-10 ENCOUNTER — Ambulatory Visit (INDEPENDENT_AMBULATORY_CARE_PROVIDER_SITE_OTHER): Payer: 59 | Admitting: Podiatry

## 2017-05-10 ENCOUNTER — Encounter: Payer: Self-pay | Admitting: Podiatry

## 2017-05-10 DIAGNOSIS — M722 Plantar fascial fibromatosis: Secondary | ICD-10-CM | POA: Diagnosis not present

## 2017-05-10 DIAGNOSIS — M76829 Posterior tibial tendinitis, unspecified leg: Secondary | ICD-10-CM

## 2017-05-10 DIAGNOSIS — M2140 Flat foot [pes planus] (acquired), unspecified foot: Secondary | ICD-10-CM

## 2017-05-10 MED ORDER — METHYLPREDNISOLONE 4 MG PO TBPK: ORAL_TABLET | ORAL | 0 refills | Status: DC

## 2017-05-10 NOTE — Progress Notes (Signed)
She presents today for follow-up of her posterior tibial tendon and Achilles tendinitis.  Objective: Vital signs are stable alert and oriented 3. Pulses are palpable. She has pain on palpation medial calcaneal tubercle as well as the posterior tibial tendon. She states that she was doing better while she is wearing the boot and states it is actually feeling a lot better than it was prior.  MRI does demonstrate posterior tibial tendinitis and significant plantar fasciitis of the right foot. No tears of the posterior tibial tendon.  Assessment posterior tibial tendinitis more than likely associated with plantar fasciitis of the right foot.  Plan: Encouraged physical therapy recommended that she continue out of work injected her left heel and placed her in a plantar fascial brace. Provided her both oral and written home going instructions as well as prescription for methylprednisolone and meloxicam. I will follow-up with her in 3 weeks at which time we may consider surgical option if she is not improved considerable. May need to consider orthotics.

## 2017-05-11 DIAGNOSIS — M2042 Other hammer toe(s) (acquired), left foot: Secondary | ICD-10-CM

## 2017-05-11 LAB — SURGICAL PATHOLOGY

## 2017-05-12 DIAGNOSIS — M722 Plantar fascial fibromatosis: Secondary | ICD-10-CM | POA: Diagnosis not present

## 2017-05-12 DIAGNOSIS — M25572 Pain in left ankle and joints of left foot: Secondary | ICD-10-CM | POA: Diagnosis not present

## 2017-05-13 DIAGNOSIS — H524 Presbyopia: Secondary | ICD-10-CM | POA: Diagnosis not present

## 2017-05-17 ENCOUNTER — Encounter: Payer: Self-pay | Admitting: Podiatry

## 2017-05-17 ENCOUNTER — Telehealth: Payer: Self-pay

## 2017-05-17 NOTE — Progress Notes (Signed)
LVM for patient to call and schedule surgical consult  Also sent schedulers a message to call patient for consult appt

## 2017-05-17 NOTE — Telephone Encounter (Signed)
-----   Message from Garrel Ridgel, [place] sent at 05/17/2017 12:54 PM EDT ----- Have her in for a surgical consult.

## 2017-05-17 NOTE — Telephone Encounter (Signed)
LVM for patient to call to get a surgical consult appt. Also sent a message to with schedulers to call patient for consult appt

## 2017-05-18 ENCOUNTER — Telehealth: Payer: Self-pay | Admitting: Podiatry

## 2017-05-18 ENCOUNTER — Telehealth: Payer: Self-pay

## 2017-05-18 NOTE — Telephone Encounter (Signed)
Spoke with patient advising her of why Dr Milinda Pointer wants her to come in for a surgical consult, [facility] overread results indicated torn tendon. I advised her to wear the boot if possible but she states the boot causes her to have knee problems. I advised her that Dr Milinda Pointer would explain options and recovery time. Transferred her to schedulers for sooner appt, current appt is 06/14/17

## 2017-05-18 NOTE — Telephone Encounter (Signed)
I'm calling the nurse Janett Billow back from the message she left on my machine yesterday. I just saw Dr. Milinda Pointer two weeks ago and he went over my MRI results with me and had me start physical therapy. He never mentioned surgery to me so I don't know why I would need a surgical consult appointment.

## 2017-05-21 ENCOUNTER — Other Ambulatory Visit: Payer: Self-pay | Admitting: Family Medicine

## 2017-05-21 DIAGNOSIS — I10 Essential (primary) hypertension: Secondary | ICD-10-CM

## 2017-05-21 NOTE — Telephone Encounter (Signed)
Please ask patient to make an appt; need to recheck her A1c; see note attached to the last A1c; I'll refill her med, but we need to verify if she has diabetes or not

## 2017-05-24 NOTE — Telephone Encounter (Signed)
LMOM to inform to schedule an appt

## 2017-06-14 ENCOUNTER — Encounter: Payer: Self-pay | Admitting: Podiatry

## 2017-06-14 ENCOUNTER — Ambulatory Visit (INDEPENDENT_AMBULATORY_CARE_PROVIDER_SITE_OTHER): Payer: 59 | Admitting: Podiatry

## 2017-06-14 ENCOUNTER — Telehealth: Payer: Self-pay | Admitting: *Deleted

## 2017-06-14 DIAGNOSIS — M76829 Posterior tibial tendinitis, unspecified leg: Secondary | ICD-10-CM

## 2017-06-14 DIAGNOSIS — M2140 Flat foot [pes planus] (acquired), unspecified foot: Secondary | ICD-10-CM | POA: Diagnosis not present

## 2017-06-14 NOTE — Telephone Encounter (Addendum)
"  I just saw Dr. Milinda Pointer today.  I'd like to schedule my surgery."  Dr. Stephenie Acres next available is October 5.  "Put me down for that date.  If there's any cancellations for a sooner date can you call me?  I've been dealing with this for a long time and my time off is about to run out."  I will let you know if there's any cancellations.

## 2017-06-14 NOTE — Patient Instructions (Signed)
Pre-Operative Instructions  Congratulations, you have decided to take an important step towards improving your quality of life.  You can be assured that the doctors and staff at [facility] will be with you every step of the way.  Here are some important things you should know:  1. Plan to be at the surgery center/hospital at least 1 (one) hour prior to your scheduled time, unless otherwise directed by the surgical center/hospital staff.  You must have a responsible adult accompany you, remain during the surgery and drive you home.  Make sure you have directions to the surgical center/hospital to ensure you arrive on time. 2. If you are having surgery at [facility] or [facility] hospitals, you will need a copy of your medical history and physical form from your family physician within one month prior to the date of surgery. We will give you a form for your primary physician to complete.  3. We make every effort to accommodate the date you request for surgery.  However, there are times where surgery dates or times have to be moved.  We will contact you as soon as possible if a change in schedule is required.   4. No aspirin/ibuprofen for one week before surgery.  If you are on aspirin, any non-steroidal anti-inflammatory medications (Mobic, Aleve, Ibuprofen) should not be taken seven (7) days prior to your surgery.  You make take Tylenol for pain prior to surgery.  5. Medications - If you are taking daily heart and blood pressure medications, seizure, reflux, allergy, asthma, anxiety, pain or diabetes medications, make sure you notify the surgery center/hospital before the day of surgery so they can tell you which medications you should take or avoid the day of surgery. 6. No food or drink after midnight the night before surgery unless directed otherwise by surgical center/hospital staff. 7. No alcoholic beverages 24-hours prior to surgery.  No smoking 24-hours prior or 24-hours after  surgery. 8. Wear loose pants or shorts. They should be loose enough to fit over bandages, boots, and casts. 9. Don't wear slip-on shoes. Sneakers are preferred. 10. Bring your boot with you to the surgery center/hospital.  Also bring crutches or a walker if your physician has prescribed it for you.  If you do not have this equipment, it will be provided for you after surgery. 11. If you have not been contacted by the surgery center/hospital by the day before your surgery, call to confirm the date and time of your surgery. 12. Leave-time from work may vary depending on the type of surgery you have.  Appropriate arrangements should be made prior to surgery with your employer. 13. Prescriptions will be provided immediately following surgery by your doctor.  Fill these as soon as possible after surgery and take the medication as directed. Pain medications will not be refilled on weekends and must be approved by the doctor. 14. Remove nail polish on the operative foot and avoid getting pedicures prior to surgery. 15. Wash the night before surgery.  The night before surgery wash the foot and leg well with water and the antibacterial soap provided. Be sure to pay special attention to beneath the toenails and in between the toes.  Wash for at least three (3) minutes. Rinse thoroughly with water and dry well with a towel.  Perform this wash unless told not to do so by your physician.  Enclosed: 1 Ice pack (please put in freezer the night before surgery)   1 Hibiclens skin cleaner     Pre-op instructions  If you have any questions regarding the instructions, please do not hesitate to call our office.  [place]: [address], [place], [place] [address] -- [phone]  [place]: [address] [place], [place] [address] -- [phone]  [place]: [address]  [place], [place] [address] -- [phone]  [place]: [address], [address], [place],  [address] -- [phone]  Website:  [email or web address] 

## 2017-06-15 ENCOUNTER — Encounter: Payer: Self-pay | Admitting: Family Medicine

## 2017-06-15 ENCOUNTER — Ambulatory Visit (INDEPENDENT_AMBULATORY_CARE_PROVIDER_SITE_OTHER): Payer: 59

## 2017-06-15 VITALS — BP 146/92

## 2017-06-15 DIAGNOSIS — R7301 Impaired fasting glucose: Secondary | ICD-10-CM | POA: Diagnosis not present

## 2017-06-15 DIAGNOSIS — Z23 Encounter for immunization: Secondary | ICD-10-CM | POA: Diagnosis not present

## 2017-06-15 LAB — GLUCOSE, POCT (MANUAL RESULT ENTRY): POC GLUCOSE: 122 mg/dL — AB (ref 70–99)

## 2017-06-15 NOTE — Progress Notes (Signed)
She presents today for follow-up of her posterior tibial tendinitis of her left foot as well as her painful bunion deformity and hammertoe deformities. She states this really not getting any better and is starting to affect my ability to perform her daily activities I am currently still on short-term disability and need to work.  Objective: Vital signs are stable alert and oriented 3. Pulses are palpable. Neurologic sensorium is intact the tendon reflexes are intact. She has significant pain on palpation of the posterior tibial tendon she has significant discomfort on palpation and range of motion of the first metatarsophalangeal joint. C severely contracted hammertoe deformities 2 through 5 of the left foot. MRI of the left foot does demonstrate fraying of the posterior tibial tendon at its insertion site on the navicular tuberosity.  Assessment: Hammertoe deformity hallux valgus deformity and most significantly posterior tibial tendon tear left.  Plan: Discussed etiology pathology conservative versus surgical therapies. At this point we consented her today for a [facility] procedure posterior tibial tendon advancement with repair of the tendon. Also discussed casting. We also discussed and consented her for an [facility] bunion repair hammertoe repair 2 through 5 of the left foot with internal fixation. I answered all the questions were that she had regarding these procedures to the best of my ability in layman's terms. She understood this and is amenable to it. We did discuss the possible postop complications which may include but are not limited to postop pain bleeding swelling infection recurrence and need for further surgery overcorrection under correction risk of blood clot or pulmonary embolism which did threaten her life. She understands this and is amenable to it.

## 2017-06-23 DIAGNOSIS — M2042 Other hammer toe(s) (acquired), left foot: Secondary | ICD-10-CM

## 2017-06-24 DIAGNOSIS — M2042 Other hammer toe(s) (acquired), left foot: Secondary | ICD-10-CM

## 2017-06-27 IMAGING — MG MM DIGITAL SCREENING BILAT W/ TOMO W/ CAD
8 of 15 series · 8 of 35 positions shown · non-contrast
Comparison: Previous exam(s).

CLINICAL DATA: Screening.

EXAM:
DIGITAL SCREENING BILATERAL MAMMOGRAM WITH 3D TOMO WITH CAD

[L MLO]
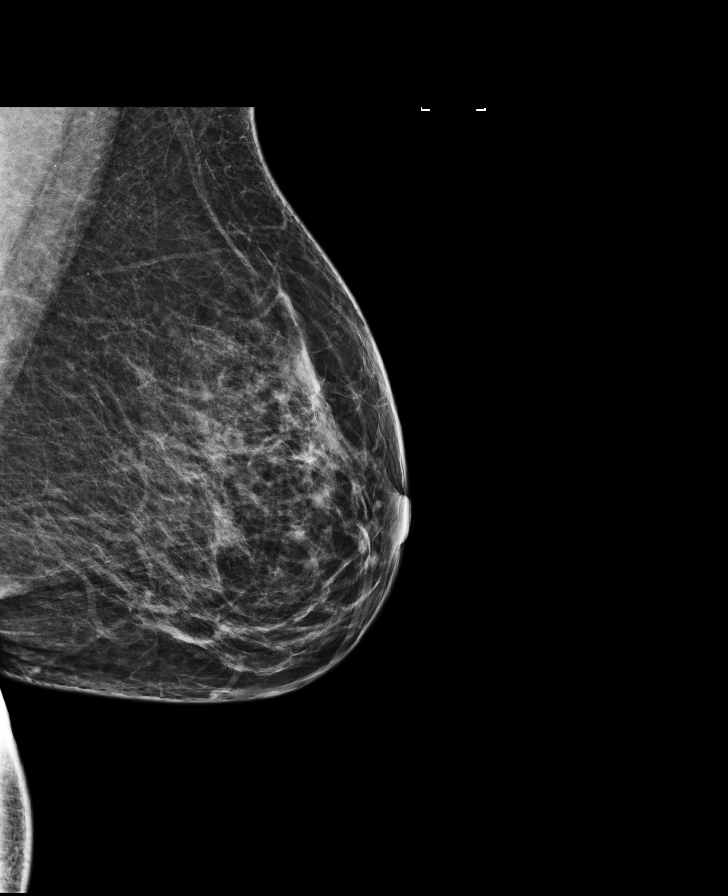

[R CC]
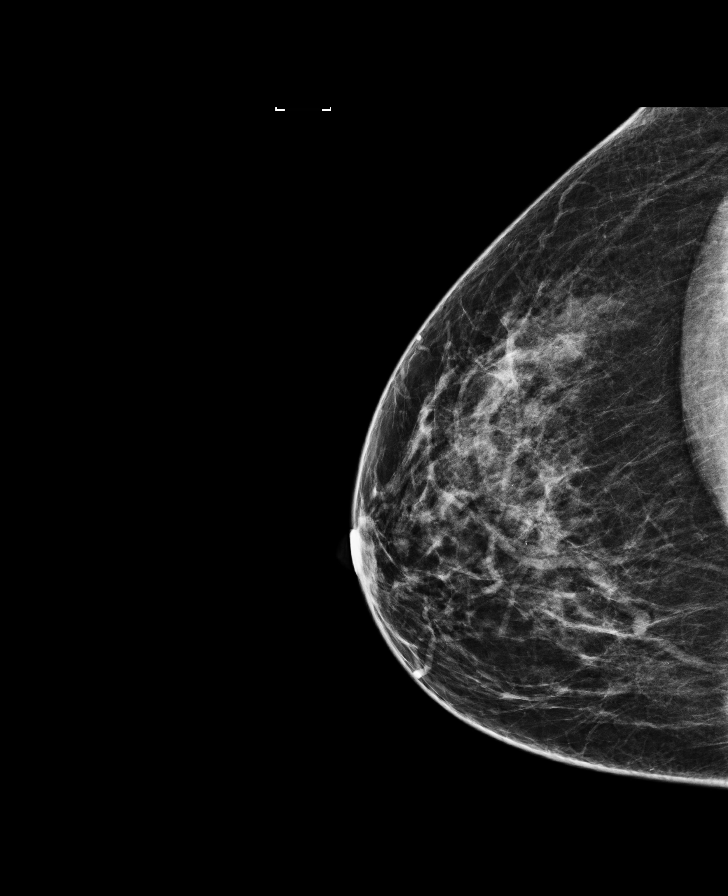

[L CC synth-2D]
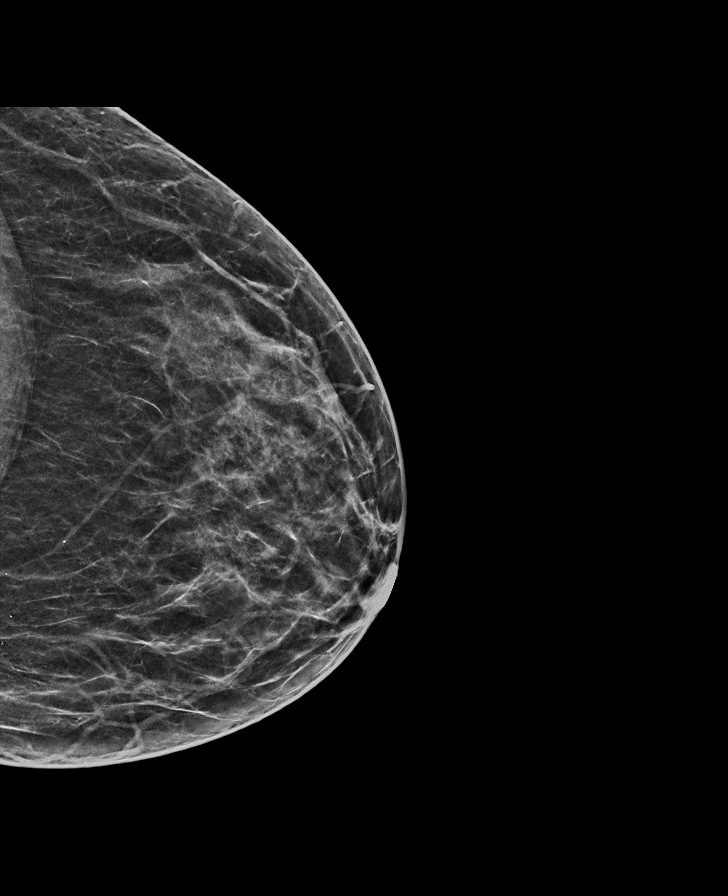

[L MLO synth-2D (1 of 2)]
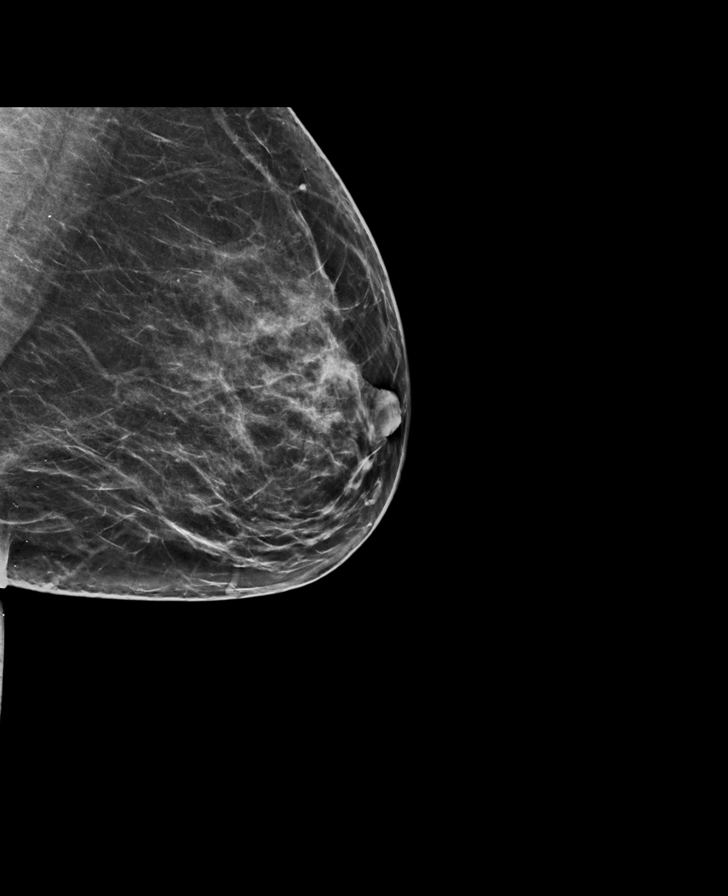

[R CC synth-2D]
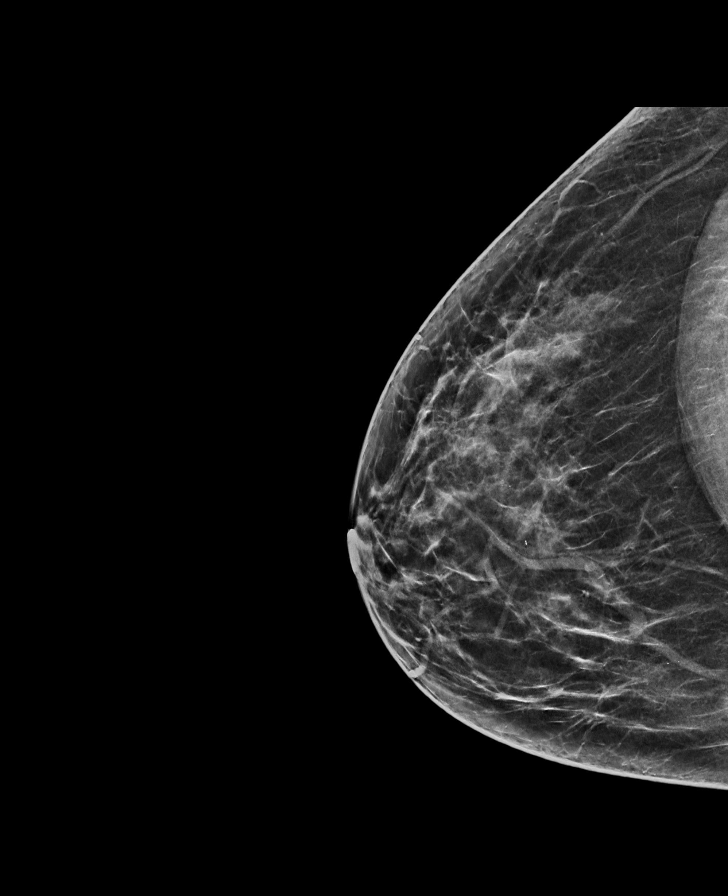

[R MLO]
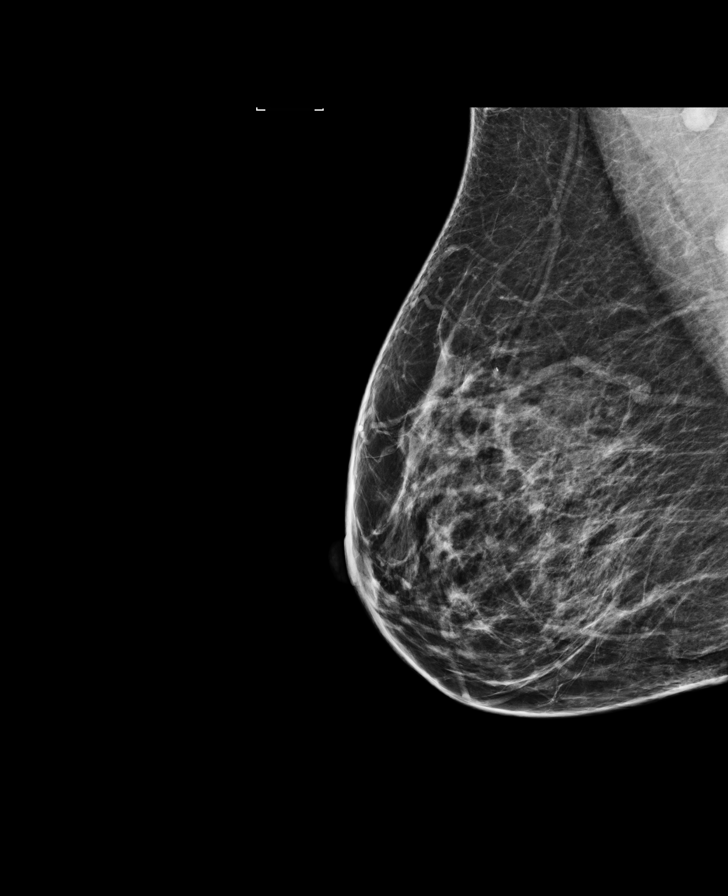

[L MLO synth-2D (2 of 2)]
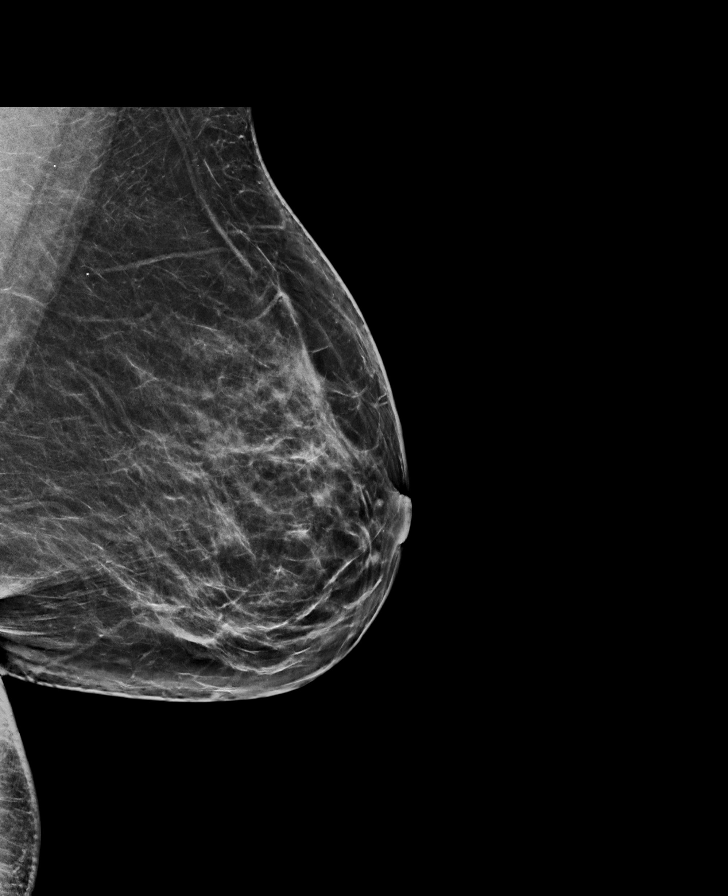

[R MLO synth-2D]
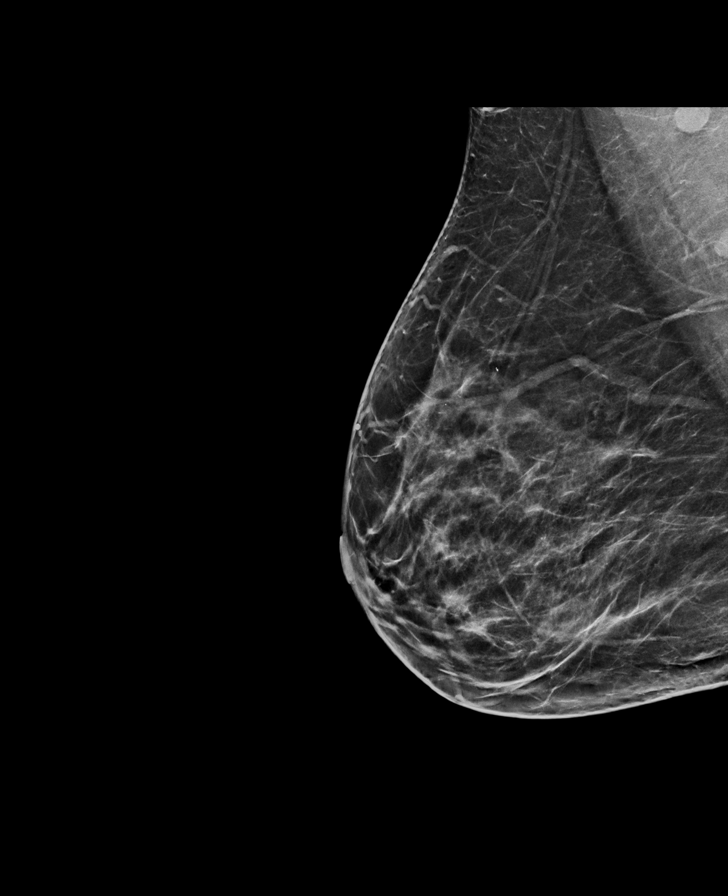

[8 of 35 positions shown; findings below may reference images not displayed]

ACR Breast Density Category c: The breast tissue is heterogeneously
dense, which may obscure small masses.
FINDINGS: There are no findings suspicious for malignancy. Images were
processed with CAD.
IMPRESSION: No mammographic evidence of malignancy. A result letter of this
screening mammogram will be mailed directly to the patient.

RECOMMENDATION:
Screening mammogram in one year. (Code:[id number])

BI-RADS CATEGORY  1: Negative.

## 2017-07-06 ENCOUNTER — Ambulatory Visit (INDEPENDENT_AMBULATORY_CARE_PROVIDER_SITE_OTHER): Payer: 59 | Admitting: Family Medicine

## 2017-07-06 ENCOUNTER — Encounter: Payer: Self-pay | Admitting: Family Medicine

## 2017-07-06 VITALS — BP 138/82 | HR 92 | Temp 97.4°F | Resp 14 | Wt 229.0 lb

## 2017-07-06 DIAGNOSIS — E785 Hyperlipidemia, unspecified: Secondary | ICD-10-CM | POA: Diagnosis not present

## 2017-07-06 DIAGNOSIS — E118 Type 2 diabetes mellitus with unspecified complications: Secondary | ICD-10-CM

## 2017-07-06 DIAGNOSIS — E7800 Pure hypercholesterolemia, unspecified: Secondary | ICD-10-CM | POA: Insufficient documentation

## 2017-07-06 DIAGNOSIS — R7301 Impaired fasting glucose: Secondary | ICD-10-CM

## 2017-07-06 DIAGNOSIS — Z794 Long term (current) use of insulin: Secondary | ICD-10-CM

## 2017-07-06 DIAGNOSIS — I10 Essential (primary) hypertension: Secondary | ICD-10-CM | POA: Diagnosis not present

## 2017-07-06 DIAGNOSIS — E119 Type 2 diabetes mellitus without complications: Secondary | ICD-10-CM | POA: Insufficient documentation

## 2017-07-06 HISTORY — DX: Type 2 diabetes mellitus without complications: E11.9

## 2017-07-06 LAB — POCT GLYCOSYLATED HEMOGLOBIN (HGB A1C): HEMOGLOBIN A1C: 6.5

## 2017-07-06 NOTE — Patient Instructions (Addendum)
Consider a medicine like Victoza (once a day) or Ozempic (once a week) Let me know which you'd like to prefer  Check out the information at [email or web address] entitled "Nutrition for Weight Loss: What You Need to Know about Fad Diets" Try to lose between 1-2 pounds per week by taking in fewer calories and burning off more calories You can succeed by limiting portions, limiting foods dense in calories and fat, becoming more active, and drinking 8 glasses of water a day (64 ounces) Don't skip meals, especially breakfast, as skipping meals may alter your metabolism Do not use over-the-counter weight loss pills or gimmicks that claim rapid weight loss A healthy BMI (or body mass index) is between 18.5 and 24.9 You can calculate your ideal BMI at the [employer] website [email or web address]  I will recommend that you start a baby coated aspirin for heart protection after your surgery  Semaglutide injection solution What is this medicine? SEMAGLUTIDE (Sem a GLOO tide) is used to improve blood sugar control in adults with type 2 diabetes. This medicine may be used with other diabetes medicines. This medicine may be used for other purposes; ask your health care provider or pharmacist if you have questions. COMMON BRAND NAME(S): OZEMPIC What should I tell my health care provider before I take this medicine? They need to know if you have any of these conditions: -endocrine tumors (MEN 2) or if someone in your family had these tumors -eye disease, vision problems -history of pancreatitis -kidney disease -stomach problems -thyroid cancer or if someone in your family had thyroid cancer -an unusual or allergic reaction to semaglutide, other medicines, foods, dyes, or preservatives -pregnant or trying to get pregnant -breast-feeding How should I use this medicine? This medicine is for injection under the skin of your upper leg (thigh), stomach area, or upper arm.  It is given once every week (every 7 days). You will be taught how to prepare and give this medicine. Use exactly as directed. Take your medicine at regular intervals. Do not take it more often than directed. If you use this medicine with insulin, you should inject this medicine and the insulin separately. Do not mix them together. Do not give the injections right next to each other. Change (rotate) injection sites with each injection. It is important that you put your used needles and syringes in a special sharps container. Do not put them in a trash can. If you do not have a sharps container, call your pharmacist or healthcare provider to get one. A special MedGuide will be given to you by the pharmacist with each prescription and refill. Be sure to read this information carefully each time. Talk to your pediatrician regarding the use of this medicine in children. Special care may be needed. Overdosage: If you think you have taken too much of this medicine contact a poison control center or emergency room at once. NOTE: This medicine is only for you. Do not share this medicine with others. What if I miss a dose? If you miss a dose, take it as soon as you can within 5 days after the missed dose. Then take your next dose at your regular weekly time. If it has been longer than 5 days after the missed dose, do not take the missed dose. Take the next dose at your regular time. Do not take double or extra doses. If you have questions about a missed dose, contact your health care provider for advice. What may interact with this medicine? -other medicines for  diabetes Many medications may cause changes in blood sugar, these include: -alcohol containing beverages -antiviral medicines for HIV or AIDS -aspirin and aspirin-like drugs -certain medicines for blood pressure, heart disease, irregular heart beat -chromium -diuretics -female hormones, such as estrogens or progestins, birth control  pills -fenofibrate -gemfibrozil -isoniazid -lanreotide -female hormones or anabolic steroids -MAOIs like Carbex, Eldepryl, Marplan, Nardil, and Parnate -medicines for weight loss -medicines for allergies, asthma, cold, or cough -medicines for depression, anxiety, or psychotic disturbances -niacin -nicotine -NSAIDs, medicines for pain and inflammation, like ibuprofen or naproxen -octreotide -pasireotide -pentamidine -phenytoin -probenecid -quinolone antibiotics such as ciprofloxacin, levofloxacin, ofloxacin -some herbal dietary supplements -steroid medicines such as prednisone or cortisone -sulfamethoxazole; trimethoprim -thyroid hormones Some medications can hide the warning symptoms of low blood sugar (hypoglycemia). You may need to monitor your blood sugar more closely if you are taking one of these medications. These include: -beta-blockers, often used for high blood pressure or heart problems (examples include atenolol, metoprolol, propranolol) -clonidine -guanethidine -reserpine This list may not describe all possible interactions. Give your health care provider a list of all the medicines, herbs, non-prescription drugs, or dietary supplements you use. Also tell them if you smoke, drink alcohol, or use illegal drugs. Some items may interact with your medicine. What should I watch for while using this medicine? Visit your doctor or health care professional for regular checks on your progress. Drink plenty of fluids while taking this medicine. Check with your doctor or health care professional if you get an attack of severe diarrhea, nausea, and vomiting. The loss of too much body fluid can make it dangerous for you to take this medicine. A test called the HbA1C (A1C) will be monitored. This is a simple blood test. It measures your blood sugar control over the last 2 to 3 months. You will receive this test every 3 to 6 months. Learn how to check your blood sugar. Learn the symptoms  of low and high blood sugar and how to manage them. Always carry a quick-source of sugar with you in case you have symptoms of low blood sugar. Examples include hard sugar candy or glucose tablets. Make sure others know that you can choke if you eat or drink when you develop serious symptoms of low blood sugar, such as seizures or unconsciousness. They must get medical help at once. Tell your doctor or health care professional if you have high blood sugar. You might need to change the dose of your medicine. If you are sick or exercising more than usual, you might need to change the dose of your medicine. Do not skip meals. Ask your doctor or health care professional if you should avoid alcohol. Many nonprescription cough and cold products contain sugar or alcohol. These can affect blood sugar. Pens should never be shared. Even if the needle is changed, sharing may result in passing of viruses like hepatitis or HIV. Wear a medical ID bracelet or chain, and carry a card that describes your disease and details of your medicine and dosage times. What side effects may I notice from receiving this medicine? Side effects that you should report to your doctor or health care professional as soon as possible: -allergic reactions like skin rash, itching or hives, swelling of the face, lips, or tongue -breathing problems -changes in vision -diarrhea that continues or is severe -lump or swelling on the neck -severe nausea -signs and symptoms of infection like fever or chills; cough; sore throat; pain or trouble passing urine -signs and  symptoms of low blood sugar such as feeling anxious, confusion, dizziness, increased hunger, unusually weak or tired, sweating, shakiness, cold, irritable, headache, blurred vision, fast heartbeat, loss of consciousness -signs and symptoms of kidney injury like trouble passing urine or change in the amount of urine -trouble swallowing -unusual stomach upset or  pain -vomiting Side effects that usually do not require medical attention (report to your doctor or health care professional if they continue or are bothersome): -constipation -diarrhea -nausea -pain, redness, or irritation at site where injected -stomach upset This list may not describe all possible side effects. Call your doctor for medical advice about side effects. You may report side effects to FDA at 1-800-FDA-1088. Where should I keep my medicine? Keep out of the reach of children. Store unopened pens in a refrigerator between 2 and 8 degrees C (36 and 46 degrees F). Do not freeze. Protect from light and heat. After you first use the pen, it can be stored for 56 days at room temperature between 15 and 30 degrees C (59 and 86 degrees F) or in a refrigerator. Throw away your used pen after 56 days or after the expiration date, whichever comes first. Do not store your pen with the needle attached. If the needle is left on, medicine may leak from the pen. NOTE: This sheet is a summary. It may not cover all possible information. If you have questions about this medicine, talk to your doctor, pharmacist, or health care provider.  2018 [employer] (2016-10-08 14:43:35) Liraglutide injection What is this medicine? LIRAGLUTIDE (LIR a GLOO tide) is used to improve blood sugar control in adults with type 2 diabetes. This medicine may be used with other diabetes medicines. This drug may also reduce the risk of heart attack or stroke if you have type 2 diabetes and risk factors for heart disease. This medicine may be used for other purposes; ask your health care provider or pharmacist if you have questions. COMMON BRAND NAME(S): Victoza What should I tell my health care provider before I take this medicine? They need to know if you have any of these conditions: -endocrine tumors (MEN 2) or if someone in your family had these tumors -gallbladder disease -high cholesterol -history of  alcohol abuse problem -history of pancreatitis -kidney disease or if you are on dialysis -liver disease -previous swelling of the tongue, face, or lips with difficulty breathing, difficulty swallowing, hoarseness, or tightening of the throat -stomach problems -thyroid cancer or if someone in your family had thyroid cancer -an unusual or allergic reaction to liraglutide, other medicines, foods, dyes, or preservatives -pregnant or trying to get pregnant -breast-feeding How should I use this medicine? This medicine is for injection under the skin of your upper leg, stomach area, or upper arm. You will be taught how to prepare and give this medicine. Use exactly as directed. Take your medicine at regular intervals. Do not take it more often than directed. It is important that you put your used needles and syringes in a special sharps container. Do not put them in a trash can. If you do not have a sharps container, call your pharmacist or healthcare provider to get one. A special MedGuide will be given to you by the pharmacist with each prescription and refill. Be sure to read this information carefully each time. Talk to your pediatrician regarding the use of this medicine in children. Special care may be needed. Overdosage: If you think you have taken too much of this medicine contact a  poison control center or emergency room at once. NOTE: This medicine is only for you. Do not share this medicine with others. What if I miss a dose? If you miss a dose, take it as soon as you can. If it is almost time for your next dose, take only that dose. Do not take double or extra doses. What may interact with this medicine? -other medicines for diabetes Many medications may cause changes in blood sugar, these include: -alcohol containing beverages -antiviral medicines for HIV or AIDS -aspirin and aspirin-like drugs -certain medicines for blood pressure, heart disease, irregular heart  beat -chromium -diuretics -female hormones, such as estrogens or progestins, birth control pills -fenofibrate -gemfibrozil -isoniazid -lanreotide -female hormones or anabolic steroids -MAOIs like Carbex, Eldepryl, Marplan, Nardil, and Parnate -medicines for weight loss -medicines for allergies, asthma, cold, or cough -medicines for depression, anxiety, or psychotic disturbances -niacin -nicotine -NSAIDs, medicines for pain and inflammation, like ibuprofen or naproxen -octreotide -pasireotide -pentamidine -phenytoin -probenecid -quinolone antibiotics such as ciprofloxacin, levofloxacin, ofloxacin -some herbal dietary supplements -steroid medicines such as prednisone or cortisone -sulfamethoxazole; trimethoprim -thyroid hormones Some medications can hide the warning symptoms of low blood sugar (hypoglycemia). You may need to monitor your blood sugar more closely if you are taking one of these medications. These include: -beta-blockers, often used for high blood pressure or heart problems (examples include atenolol, metoprolol, propranolol) -clonidine -guanethidine -reserpine This list may not describe all possible interactions. Give your health care provider a list of all the medicines, herbs, non-prescription drugs, or dietary supplements you use. Also tell them if you smoke, drink alcohol, or use illegal drugs. Some items may interact with your medicine. What should I watch for while using this medicine? Visit your doctor or health care professional for regular checks on your progress. Drink plenty of fluids while taking this medicine. Check with your doctor or health care professional if you get an attack of severe diarrhea, nausea, and vomiting. The loss of too much body fluid can make it dangerous for you to take this medicine. A test called the HbA1C (A1C) will be monitored. This is a simple blood test. It measures your blood sugar control over the last 2 to 3 months. You will  receive this test every 3 to 6 months. Learn how to check your blood sugar. Learn the symptoms of low and high blood sugar and how to manage them. Always carry a quick-source of sugar with you in case you have symptoms of low blood sugar. Examples include hard sugar candy or glucose tablets. Make sure others know that you can choke if you eat or drink when you develop serious symptoms of low blood sugar, such as seizures or unconsciousness. They must get medical help at once. Tell your doctor or health care professional if you have high blood sugar. You might need to change the dose of your medicine. If you are sick or exercising more than usual, you might need to change the dose of your medicine. Do not skip meals. Ask your doctor or health care professional if you should avoid alcohol. Many nonprescription cough and cold products contain sugar or alcohol. These can affect blood sugar. Pens should never be shared. Even if the needle is changed, sharing may result in passing of viruses like hepatitis or HIV. Wear a medical ID bracelet or chain, and carry a card that describes your disease and details of your medicine and dosage times. What side effects may I notice from receiving this medicine? Side  effects that you should report to your doctor or health care professional as soon as possible: -allergic reactions like skin rash, itching or hives, swelling of the face, lips, or tongue -breathing problems -diarrhea that continues or is severe -lump or swelling on the neck -severe nausea -signs and symptoms of infection like fever or chills; cough; sore throat; pain or trouble passing urine -signs and symptoms of low blood sugar such as feeling anxious, confusion, dizziness, increased hunger, unusually weak or tired, sweating, shakiness, cold, irritable, headache, blurred vision, fast heartbeat, loss of consciousness -signs and symptoms of kidney injury like trouble passing urine or change in the amount  of urine -trouble swallowing -unusual stomach upset or pain -vomiting Side effects that usually do not require medical attention (report to your doctor or health care professional if they continue or are bothersome): -constipation -decreased appetite -diarrhea -fatigue -headache -nausea -pain, redness, or irritation at site where injected -stomach upset -stuffy or runny nose This list may not describe all possible side effects. Call your doctor for medical advice about side effects. You may report side effects to FDA at 1-800-FDA-1088. Where should I keep my medicine? Keep out of the reach of children. Store unopened pen in a refrigerator between 2 and 8 degrees C (36 and 46 degrees F). Do not freeze or use if the medicine has been frozen. Protect from light and excessive heat. After you first use the pen, it can be stored at room temperature between 15 and 30 degrees C (59 and 86 degrees F) or in a refrigerator. Throw away your used pen after 30 days or after the expiration date, whichever comes first. Do not store your pen with the needle attached. If the needle is left on, medicine may leak from the pen. NOTE: This sheet is a summary. It may not cover all possible information. If you have questions about this medicine, talk to your doctor, pharmacist, or health care provider.  2018 [employer] (2016-10-08 14:39:40)

## 2017-07-06 NOTE — Progress Notes (Signed)
BP 138/82   Pulse 92   Temp (!) 97.4 F (36.3 C) (Oral)   Resp 14   Wt 229 lb (103.9 kg)   LMP 04/01/2016   SpO2 94%   BMI 39.31 kg/m    Subjective:    Patient ID: Natasha Mitchell, female    DOB: 1965-12-07, 51 y.o.   MRN: [id number]  HPI: [place] is a 51 y.o. female  Chief Complaint  Patient presents with  . Follow-up    on sugar and bp   HPI Patient is here for f/u on glucose and HTN  Her BP today is reviewed, 138 is down from 146 last check; diastolic is also better; rushed around this morning Just started the higher dose of verapamil 2 weeks BP Readings from Last 3 Encounters:  07/06/17 138/82  06/15/17 (!) 146/92  05/07/17 133/71   She has "prediabetes" listed in her problem list; glucose 128, 129, 142; can tell the difference with what she eats; lower readings are 96; limiting potatoes and rice; breakfast every morning with her mother, scrambled eggs and cheese and toast and coffee; last A1c was 6.5, only reading prior that was in diabetes range No hx of MTC; no MEN-2; no hx of pancreatitis  She is getting bigger and bigger, weight at home is 219 pounds; cannot be as active; has a torn tendon in the left foot; having surgery on Friday; she has gained 10 pounds since August  She quit taking Prozac; it was not helping  Just had colonoscopy, no cancer  High cholseterol; patient does not want a statin, concerned about safety  Depression screen [facility] 2/9 07/06/2017 03/18/2017 08/10/2016 02/18/2016 01/21/2016  Decreased Interest 0 0 0 0 0  Down, Depressed, Hopeless 0 0 0 0 0  PHQ - 2 Score 0 0 0 0 0    Relevant past medical, surgical, family and social history reviewed Past Medical History:  Diagnosis Date  . Anemia    H/O PRIOR TO HYSTERECTOMY  . Anxiety   . Arthritis   . Family hx of colon cancer 02/15/2016  . Headache   . History of uterine fibroid    s/p abdominal hysterectomy with LSO and bilateral salpingectomy 03/2016  . Hypertension   .  Intermittent palpitations    SINCE TAKING VERAPAMIL AND DECREASING CAFFEINE INTAKE, PALITATIONS ARE VERY MINIMAL PER PT (02-03-17)  . Prediabetes   . Type 2 diabetes mellitus ([place]) 07/06/2017  . Vein, varicose 2016   Past Surgical History:  Procedure Laterality Date  . ABLATION SAPHENOUS VEIN W/ RFA Bilateral    femoral vein ([place] [facility])  . COLONIC ADENOMA     HIGH GRADE DYSPLASIA  . COLONOSCOPY WITH PROPOFOL N/A 02/11/2016   Procedure: COLONOSCOPY WITH PROPOFOL;  Surgeon: Lollie Sails, MD;  Location[facility] ENDOSCOPY;  Service: Endoscopy;  Laterality: N/A;  . FLEXIBLE SIGMOIDOSCOPY N/A 05/07/2017   Procedure: FLEXIBLE SIGMOIDOSCOPY;  Surgeon: Lollie Sails, MD;  Location: [facility] ENDOSCOPY;  Service: Endoscopy;  Laterality: N/A;  . HYSTERECTOMY ABDOMINAL WITH SALPINGECTOMY Bilateral 04/06/2016   Procedure: HYSTERECTOMY ABDOMINAL WITH BILATERAL SALPINGECTOMY;  Surgeon: Rubie Maid, MD;  Location: [facility] ORS;  Service: Gynecology;  Laterality: Bilateral;  . LIPOMA EXCISION Right 02/18/2017   Procedure: EXCISION LIPOMA ON MID BACK;  Surgeon: Clayburn Pert, MD;  Location: [facility] ORS;  Service: General;  Laterality: Right;  . MOUTH SURGERY  2001  . TUBAL LIGATION  1998   Family History  Problem Relation Age of Onset  .  Birth defects Mother        ablation in heart  . Hearing loss Father   . Breast cancer Neg Hx    Social History   Social History  . Marital status: Single    Spouse name: N/A  . Number of children: N/A  . Years of education: N/A   Occupational History  . Not on file.   Social History Main Topics  . Smoking status: Former Smoker    Packs/day: 1.50    Years: 15.00    Types: Cigarettes    Quit date: 10/06/1999  . Smokeless tobacco: Never Used     Comment: quit in 2001  . Alcohol use 1.8 - 2.4 oz/week    1 Glasses of wine, 1 Cans of beer, 1 - 2 Standard drinks or equivalent per week     Comment: occasional beer or wine  . Drug use: No  . Sexual  activity: No   Other Topics Concern  . Not on file   Social History Narrative  . No narrative on file    Interim medical history since last visit reviewed. Allergies and medications reviewed  Review of Systems Per HPI unless specifically indicated above     Objective:    BP 138/82   Pulse 92   Temp (!) 97.4 F (36.3 C) (Oral)   Resp 14   Wt 229 lb (103.9 kg)   LMP 04/01/2016   SpO2 94%   BMI 39.31 kg/m   Wt Readings from Last 3 Encounters:  07/06/17 229 lb (103.9 kg)  05/07/17 219 lb (99.3 kg)  03/18/17 219 lb 1.6 oz (99.4 kg)    Physical Exam  Constitutional: She appears well-developed and well-nourished. No distress.  Obese, weight gain noted  HENT:  Head: Normocephalic and atraumatic.  Eyes: EOM are normal. No scleral icterus.  Neck: No thyromegaly present.  Cardiovascular: Normal rate, regular rhythm and normal heart sounds.   Pulmonary/Chest: Effort normal and breath sounds normal. No respiratory distress. She has no wheezes.  Abdominal: Soft. Bowel sounds are normal. She exhibits no distension.  Musculoskeletal: She exhibits no edema.       Left foot: There is decreased range of motion (in a hard soled boot).  Neurological: She is alert. She exhibits normal muscle tone.  Skin: Skin is warm and dry. She is not diaphoretic. No pallor.  Psychiatric: She has a normal mood and affect. Her behavior is normal. Judgment and thought content normal. Her mood appears not anxious. She does not exhibit a depressed mood.   Results for orders placed or performed in visit on 07/06/17  POCT HgB A1C  Result Value Ref Range   Hemoglobin A1C 6.5       Assessment & Plan:   Problem List Items Addressed This Visit      Cardiovascular and Mediastinum   Hypertension goal BP (blood pressure) < 140/90    Significant weight gain since last visit; she will be working on weight loss; we spent time discussing new dx of diabetes; this will move her long-term BP goal down to  <130/80; will see how she does bringing this down with weight loss, offer ACE-I or ARB at f/u        Endocrine   Type 2 diabetes mellitus ([place])    Discussed new diagnosis; she has already done diabetic teaching; patient does not wish to adjust medicines at this time; she will read about medicine options to help with weight loss and sugar control; she does  not want any metformin; weight loss encouraged, along with healthy eating      RESOLVED: IFG (impaired fasting glucose) - Primary    Check A1c today to see if truly prediabetes or if she has progressed to outright type 2 diabetes; unfortunately, the A1c in the diabetes range, so she no longer has IFG, but has type 2 diabetes; discussed with patient today      Relevant Orders   POCT HgB A1C (Completed)     Other   Hyperlipidemia LDL goal <100    Encouraged weight loss, limit egg yolks to no more than 3 per week, limit cheese, avoid foods from cows and pigs; she would prefer not to take a statin; reviewed last 3 lipids; she will work on weight loss and activity         Follow up plan: Return in about 3 months (around 10/06/2017) for twenty minute follow-up with fasting labs; keep appt in Nov too.  An after-visit summary was printed and given to the patient at [place].  Please see the patient instructions which may contain other information and recommendations beyond what is mentioned above in the assessment and plan.  No orders of the defined types were placed in this encounter.   Orders Placed This Encounter  Procedures  . POCT HgB A1C    No orders of the defined types were placed in this encounter.

## 2017-07-06 NOTE — Assessment & Plan Note (Addendum)
Discussed new diagnosis; she has already done diabetic teaching; patient does not wish to adjust medicines at this time; she will read about medicine options to help with weight loss and sugar control; she does not want any metformin; weight loss encouraged, along with healthy eating

## 2017-07-06 NOTE — Assessment & Plan Note (Signed)
Encouraged weight loss, limit egg yolks to no more than 3 per week, limit cheese, avoid foods from cows and pigs; she would prefer not to take a statin; reviewed last 3 lipids; she will work on weight loss and activity

## 2017-07-07 ENCOUNTER — Other Ambulatory Visit: Payer: Self-pay | Admitting: Podiatry

## 2017-07-07 MED ORDER — CEPHALEXIN 500 MG PO CAPS: 500.0000 mg | ORAL_CAPSULE | Freq: Three times a day (TID) | ORAL | 0 refills | Status: DC

## 2017-07-07 MED ORDER — OXYCODONE-ACETAMINOPHEN 10-325 MG PO TABS: 1.0000 | ORAL_TABLET | ORAL | 0 refills | Status: DC | PRN

## 2017-07-07 MED ORDER — PROMETHAZINE HCL 25 MG PO TABS: 25.0000 mg | ORAL_TABLET | Freq: Three times a day (TID) | ORAL | 0 refills | Status: DC | PRN

## 2017-07-09 ENCOUNTER — Encounter: Payer: Self-pay | Admitting: Podiatry

## 2017-07-09 DIAGNOSIS — Q742 Other congenital malformations of lower limb(s), including pelvic girdle: Secondary | ICD-10-CM | POA: Diagnosis not present

## 2017-07-09 DIAGNOSIS — I10 Essential (primary) hypertension: Secondary | ICD-10-CM | POA: Diagnosis not present

## 2017-07-09 DIAGNOSIS — M25572 Pain in left ankle and joints of left foot: Secondary | ICD-10-CM | POA: Diagnosis not present

## 2017-07-09 DIAGNOSIS — M67874 Other specified disorders of tendon, left ankle and foot: Secondary | ICD-10-CM | POA: Diagnosis not present

## 2017-07-09 DIAGNOSIS — M76822 Posterior tibial tendinitis, left leg: Secondary | ICD-10-CM | POA: Diagnosis not present

## 2017-07-09 DIAGNOSIS — M2012 Hallux valgus (acquired), left foot: Secondary | ICD-10-CM | POA: Diagnosis not present

## 2017-07-09 DIAGNOSIS — M21612 Bunion of left foot: Secondary | ICD-10-CM | POA: Diagnosis not present

## 2017-07-09 DIAGNOSIS — M2042 Other hammer toe(s) (acquired), left foot: Secondary | ICD-10-CM | POA: Diagnosis not present

## 2017-07-09 HISTORY — PX: HAMMER TOE SURGERY: SHX385

## 2017-07-09 HISTORY — PX: BUNIONECTOMY: SHX129

## 2017-07-09 NOTE — Assessment & Plan Note (Signed)
Check A1c today to see if truly prediabetes or if she has progressed to outright type 2 diabetes; unfortunately, the A1c in the diabetes range, so she no longer has IFG, but has type 2 diabetes; discussed with patient today

## 2017-07-09 NOTE — Assessment & Plan Note (Signed)
Significant weight gain since last visit; she will be working on weight loss; we spent time discussing new dx of diabetes; this will move her long-term BP goal down to <130/80; will see how she does bringing this down with weight loss, offer ACE-I or ARB at f/u

## 2017-07-14 ENCOUNTER — Encounter: Payer: Self-pay | Admitting: Podiatry

## 2017-07-14 ENCOUNTER — Ambulatory Visit (INDEPENDENT_AMBULATORY_CARE_PROVIDER_SITE_OTHER): Payer: 59 | Admitting: Podiatry

## 2017-07-14 ENCOUNTER — Ambulatory Visit (INDEPENDENT_AMBULATORY_CARE_PROVIDER_SITE_OTHER): Payer: 59

## 2017-07-14 VITALS — BP 154/99 | HR 107 | Temp 98.4°F

## 2017-07-14 DIAGNOSIS — Z9889 Other specified postprocedural states: Secondary | ICD-10-CM

## 2017-07-14 DIAGNOSIS — M76829 Posterior tibial tendinitis, unspecified leg: Secondary | ICD-10-CM | POA: Diagnosis not present

## 2017-07-14 NOTE — Progress Notes (Signed)
She presents today status post Austin bunionectomy hammertoe repair #2 through 5 and a Kitner posterior tibial tendon repair and advancement with cast left foot. She states that her block lasted until Monday night. She has full sensation to her toes. She has been using a knee scooter and crutches.  Objective: Vital signs are stable she is alert and oriented 3. Pulses are palpable. There is no erythema edema cellulitis drainage or odor to the toes. They have good sensation and good range of motion and are warm to the touch. Cast is intact and minimally dirty on the bottom radiographs taken today demonstrate internal fixation is positioned well no signs of crepitus.  Assessment: Well-healing surgical foot  Plan: The cast will remain on for another week she will continue range of motion exercises for the toes. Follow-up with her in 1 week.

## 2017-07-15 DIAGNOSIS — M2042 Other hammer toe(s) (acquired), left foot: Secondary | ICD-10-CM

## 2017-07-21 ENCOUNTER — Ambulatory Visit (INDEPENDENT_AMBULATORY_CARE_PROVIDER_SITE_OTHER): Payer: 59 | Admitting: Podiatry

## 2017-07-21 DIAGNOSIS — M76829 Posterior tibial tendinitis, unspecified leg: Secondary | ICD-10-CM

## 2017-07-21 DIAGNOSIS — M2012 Hallux valgus (acquired), left foot: Secondary | ICD-10-CM

## 2017-07-21 DIAGNOSIS — M2042 Other hammer toe(s) (acquired), left foot: Secondary | ICD-10-CM

## 2017-07-21 MED ORDER — OXYCODONE-ACETAMINOPHEN 10-325 MG PO TABS: 1.0000 | ORAL_TABLET | ORAL | 0 refills | Status: DC | PRN

## 2017-07-21 NOTE — Progress Notes (Signed)
She presents today 2 weeks status post Kidner procedure bunion repair hammertoe repair with screws 234 and 5 left foot. She states that she is doing great. Denies fever chills nausea vomiting muscle aches and pains.  Objective: Vital signs are stable she is alert and oriented 3. Cast was removed which appeared to be clean and intact. Pulses are palpable. Neurologic sensorium is intact. Sutures are intact moist well coapted staples along the medial aspect of the foot are intact and coapted.  Assessment well-healing surgical foot.  Plan: Redressed today dresser compressive dressing and a new cast. Follow up with her in 2 weeks for cast removal.

## 2017-07-31 ENCOUNTER — Encounter: Payer: Self-pay | Admitting: Family Medicine

## 2017-08-02 ENCOUNTER — Encounter: Payer: Self-pay | Admitting: Podiatry

## 2017-08-04 ENCOUNTER — Ambulatory Visit (INDEPENDENT_AMBULATORY_CARE_PROVIDER_SITE_OTHER): Payer: 59 | Admitting: Podiatry

## 2017-08-04 ENCOUNTER — Telehealth: Payer: Self-pay

## 2017-08-04 ENCOUNTER — Encounter: Payer: Self-pay | Admitting: Podiatry

## 2017-08-04 DIAGNOSIS — M2012 Hallux valgus (acquired), left foot: Secondary | ICD-10-CM

## 2017-08-04 DIAGNOSIS — M76829 Posterior tibial tendinitis, unspecified leg: Secondary | ICD-10-CM

## 2017-08-04 DIAGNOSIS — M2042 Other hammer toe(s) (acquired), left foot: Secondary | ICD-10-CM

## 2017-08-04 NOTE — Telephone Encounter (Signed)
Copied from [place]. 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

## 2017-08-04 NOTE — Progress Notes (Signed)
She presents today for follow-up of her Liane Comber bunion repair her Kidner procedure and hammertoe repair [phone]. She states that she is doing really well and she is eager to get her cast off. She denies calf pain shortness of breath or chest pain. Denies fever chills nausea and vomiting.  Objective: Presents with her cast intact. It appears to be dry and clean. Once removed demonstrates no erythema cellulitis drainage or odor margins remain well coapted no signs of infection. She is doing very well. She has great range of motion of the metatarsophalangeal joint with no tenderness.  Assessment: Well-healing surgical toes and foot and posterior tibial tendon left.  Plan: Placed her in a compression anklet for daytime use only and she will continue the use of her knee scooter utilizing a cam walker for the next week nonweightbearing status and then start partial weightbearing with the Cam Walker which I demonstrated to her.

## 2017-08-05 MED ORDER — CITALOPRAM HYDROBROMIDE 20 MG PO TABS: 20.0000 mg | ORAL_TABLET | Freq: Every day | ORAL | 3 refills | Status: DC

## 2017-08-05 NOTE — Progress Notes (Signed)
DOS 10.05.18 Kidner PTT repair Lt, Austin Bunion repair w screw, hammertoe repair 2,3,4,5 w screws

## 2017-08-05 NOTE — Telephone Encounter (Signed)
Rx sent 

## 2017-08-12 ENCOUNTER — Ambulatory Visit (INDEPENDENT_AMBULATORY_CARE_PROVIDER_SITE_OTHER): Payer: 59 | Admitting: Family Medicine

## 2017-08-12 ENCOUNTER — Encounter: Payer: Self-pay | Admitting: Family Medicine

## 2017-08-12 VITALS — BP 132/74 | HR 86 | Temp 97.8°F | Resp 14 | Ht 64.0 in | Wt 229.0 lb

## 2017-08-12 DIAGNOSIS — Z794 Long term (current) use of insulin: Secondary | ICD-10-CM

## 2017-08-12 DIAGNOSIS — E118 Type 2 diabetes mellitus with unspecified complications: Secondary | ICD-10-CM

## 2017-08-12 DIAGNOSIS — Z0000 Encounter for general adult medical examination without abnormal findings: Secondary | ICD-10-CM

## 2017-08-12 DIAGNOSIS — Z23 Encounter for immunization: Secondary | ICD-10-CM | POA: Diagnosis not present

## 2017-08-12 DIAGNOSIS — Z113 Encounter for screening for infections with a predominantly sexual mode of transmission: Secondary | ICD-10-CM | POA: Diagnosis not present

## 2017-08-12 DIAGNOSIS — M2042 Other hammer toe(s) (acquired), left foot: Secondary | ICD-10-CM

## 2017-08-12 DIAGNOSIS — Z9189 Other specified personal risk factors, not elsewhere classified: Secondary | ICD-10-CM

## 2017-08-12 NOTE — Assessment & Plan Note (Signed)
USPSTF grade A and B recommendations reviewed with patient; age-appropriate recommendations, preventive care, screening tests, etc discussed and encouraged; healthy living encouraged; see AVS for patient education given to patient  

## 2017-08-12 NOTE — Assessment & Plan Note (Signed)
Screening for low risk patient

## 2017-08-12 NOTE — Patient Instructions (Addendum)
We'll get labs today If you have not heard anything from my staff in a week about any orders/referrals/studies from today, please contact us here to follow-up [phone]  Health Maintenance, Female Adopting a healthy lifestyle and getting preventive care can go a long way to promote health and wellness. Talk with your health care provider about what schedule of regular examinations is right for you. This is a good chance for you to check in with your provider about disease prevention and staying healthy. In between checkups, there are plenty of things you can do on your own. Experts have done a lot of research about which lifestyle changes and preventive measures are most likely to keep you healthy. Ask your health care provider for more information. Weight and diet Eat a healthy diet  Be sure to include plenty of vegetables, fruits, low-fat dairy products, and lean protein.  Do not eat a lot of foods high in solid fats, added sugars, or salt.  Get regular exercise. This is one of the most important things you can do for your health. ? Most adults should exercise for at least 150 minutes each week. The exercise should increase your heart rate and make you sweat (moderate-intensity exercise). ? Most adults should also do strengthening exercises at least twice a week. This is in addition to the moderate-intensity exercise.  Maintain a healthy weight  Body mass index (BMI) is a measurement that can be used to identify possible weight problems. It estimates body fat based on height and weight. Your health care provider can help determine your BMI and help you achieve or maintain a healthy weight.  For females 78 years of age and older: ? A BMI below 18.5 is considered underweight. ? A BMI of 18.5 to 24.9 is normal. ? A BMI of 25 to 29.9 is considered overweight. ? A BMI of 30 and above is considered obese.  Watch levels of cholesterol and blood lipids  You should start having your blood  tested for lipids and cholesterol at 50 years of age, then have this test every 5 years.  You may need to have your cholesterol levels checked more often if: ? Your lipid or cholesterol levels are high. ? You are older than 50 years of age. ? You are at high risk for heart disease.  Cancer screening Lung Cancer  Lung cancer screening is recommended for adults 44-78 years old who are at high risk for lung cancer because of a history of smoking.  A yearly low-dose CT scan of the lungs is recommended for people who: ? Currently smoke. ? Have quit within the past 15 years. ? Have at least a 30-pack-year history of smoking. A pack year is smoking an average of one pack of cigarettes a day for 1 year.  Yearly screening should continue until it has been 15 years since you quit.  Yearly screening should stop if you develop a health problem that would prevent you from having lung cancer treatment.  Breast Cancer  Practice breast self-awareness. This means understanding how your breasts normally appear and feel.  It also means doing regular breast self-exams. Let your health care provider know about any changes, no matter how small.  If you are in your 20s or 30s, you should have a clinical breast exam (CBE) by a health care provider every 1-3 years as part of a regular health exam.  If you are 66 or older, have a CBE every year. Also consider having a breast  X-ray (mammogram) every year.  If you have a family history of breast cancer, talk to your health care provider about genetic screening.  If you are at high risk for breast cancer, talk to your health care provider about having an MRI and a mammogram every year.  Breast cancer gene (BRCA) assessment is recommended for women who have family members with BRCA-related cancers. BRCA-related cancers include: ? Breast. ? Ovarian. ? Tubal. ? Peritoneal cancers.  Results of the assessment will determine the need for genetic counseling and  BRCA1 and BRCA2 testing.  Cervical Cancer Your health care provider may recommend that you be screened regularly for cancer of the pelvic organs (ovaries, uterus, and vagina). This screening involves a pelvic examination, including checking for microscopic changes to the surface of your cervix (Pap test). You may be encouraged to have this screening done every 3 years, beginning at age 41.  For women ages 53-65, health care providers may recommend pelvic exams and Pap testing every 3 years, or they may recommend the Pap and pelvic exam, combined with testing for human papilloma virus (HPV), every 5 years. Some types of HPV increase your risk of cervical cancer. Testing for HPV may also be done on women of any age with unclear Pap test results.  Other health care providers may not recommend any screening for nonpregnant women who are considered low risk for pelvic cancer and who do not have symptoms. Ask your health care provider if a screening pelvic exam is right for you.  If you have had past treatment for cervical cancer or a condition that could lead to cancer, you need Pap tests and screening for cancer for at least 20 years after your treatment. If Pap tests have been discontinued, your risk factors (such as having a new sexual partner) need to be reassessed to determine if screening should resume. Some women have medical problems that increase the chance of getting cervical cancer. In these cases, your health care provider may recommend more frequent screening and Pap tests.  Colorectal Cancer  This type of cancer can be detected and often prevented.  Routine colorectal cancer screening usually begins at 50 years of age and continues through 50 years of age.  Your health care provider may recommend screening at an earlier age if you have risk factors for colon cancer.  Your health care provider may also recommend using home test kits to check for hidden blood in the stool.  A small camera  at the end of a tube can be used to examine your colon directly (sigmoidoscopy or colonoscopy). This is done to check for the earliest forms of colorectal cancer.  Routine screening usually begins at age 69.  Direct examination of the colon should be repeated every 5-10 years through 50 years of age. However, you may need to be screened more often if early forms of precancerous polyps or small growths are found.  Skin Cancer  Check your skin from head to toe regularly.  Tell your health care provider about any new moles or changes in moles, especially if there is a change in a mole's shape or color.  Also tell your health care provider if you have a mole that is larger than the size of a pencil eraser.  Always use sunscreen. Apply sunscreen liberally and repeatedly throughout the day.  Protect yourself by wearing long sleeves, pants, a wide-brimmed hat, and sunglasses whenever you are outside.  Heart disease, diabetes, and high blood pressure  High blood  pressure causes heart disease and increases the risk of stroke. High blood pressure is more likely to develop in: ? People who have blood pressure in the high end of the normal range (130-139/85-89 mm Hg). ? People who are overweight or obese. ? People who are African American.  If you are 67-37 years of age, have your blood pressure checked every 3-5 years. If you are 29 years of age or older, have your blood pressure checked every year. You should have your blood pressure measured twice-once when you are at a hospital or clinic, and once when you are not at a hospital or clinic. Record the average of the two measurements. To check your blood pressure when you are not at a hospital or clinic, you can use: ? An automated blood pressure machine at a pharmacy. ? A home blood pressure monitor.  If you are between 6 years and 14 years old, ask your health care provider if you should take aspirin to prevent strokes.  Have regular diabetes  screenings. This involves taking a blood sample to check your fasting blood sugar level. ? If you are at a normal weight and have a low risk for diabetes, have this test once every three years after 50 years of age. ? If you are overweight and have a high risk for diabetes, consider being tested at a younger age or more often. Preventing infection Hepatitis B  If you have a higher risk for hepatitis B, you should be screened for this virus. You are considered at high risk for hepatitis B if: ? You were born in a country where hepatitis B is common. Ask your health care provider which countries are considered high risk. ? Your parents were born in a high-risk country, and you have not been immunized against hepatitis B (hepatitis B vaccine). ? You have HIV or AIDS. ? You use needles to inject street drugs. ? You live with someone who has hepatitis B. ? You have had sex with someone who has hepatitis B. ? You get hemodialysis treatment. ? You take certain medicines for conditions, including cancer, organ transplantation, and autoimmune conditions.  Hepatitis C  Blood testing is recommended for: ? Everyone born from [birth year] through 1964. ? Anyone with known risk factors for hepatitis C.  Sexually transmitted infections (STIs)  You should be screened for sexually transmitted infections (STIs) including gonorrhea and chlamydia if: ? You are sexually active and are younger than 50 years of age. ? You are older than 50 years of age and your health care provider tells you that you are at risk for this type of infection. ? Your sexual activity has changed since you were last screened and you are at an increased risk for chlamydia or gonorrhea. Ask your health care provider if you are at risk.  If you do not have HIV, but are at risk, it may be recommended that you take a prescription medicine daily to prevent HIV infection. This is called pre-exposure prophylaxis (PrEP). You are considered at risk  if: ? You are sexually active and do not regularly use condoms or know the HIV status of your partner(s). ? You take drugs by injection. ? You are sexually active with a partner who has HIV.  Talk with your health care provider about whether you are at high risk of being infected with HIV. If you choose to begin PrEP, you should first be tested for HIV. You should then be tested every 3 months for as  long as you are taking PrEP. Pregnancy  If you are premenopausal and you may become pregnant, ask your health care provider about preconception counseling.  If you may become pregnant, take 400 to 800 micrograms (mcg) of folic acid every day.  If you want to prevent pregnancy, talk to your health care provider about birth control (contraception). Osteoporosis and menopause  Osteoporosis is a disease in which the bones lose minerals and strength with aging. This can result in serious bone fractures. Your risk for osteoporosis can be identified using a bone density scan.  If you are 94 years of age or older, or if you are at risk for osteoporosis and fractures, ask your health care provider if you should be screened.  Ask your health care provider whether you should take a calcium or vitamin D supplement to lower your risk for osteoporosis.  Menopause may have certain physical symptoms and risks.  Hormone replacement therapy may reduce some of these symptoms and risks. Talk to your health care provider about whether hormone replacement therapy is right for you. Follow these instructions at home:  Schedule regular health, dental, and eye exams.  Stay current with your immunizations.  Do not use any tobacco products including cigarettes, chewing tobacco, or electronic cigarettes.  If you are pregnant, do not drink alcohol.  If you are breastfeeding, limit how much and how often you drink alcohol.  Limit alcohol intake to no more than 1 drink per day for nonpregnant women. One drink  equals 12 ounces of beer, 5 ounces of wine, or 1 ounces of hard liquor.  Do not use street drugs.  Do not share needles.  Ask your health care provider for help if you need support or information about quitting drugs.  Tell your health care provider if you often feel depressed.  Tell your health care provider if you have ever been abused or do not feel safe at home. This information is not intended to replace advice given to you by your health care provider. Make sure you discuss any questions you have with your health care provider. Document Released: 04/06/2011 Document Revised: 02/27/2016 Document Reviewed: 06/25/2015 Elsevier Interactive Patient Education  [employer].

## 2017-08-12 NOTE — Assessment & Plan Note (Signed)
PPSV-23 recommended and given today; next booster due after 66th birthday

## 2017-08-12 NOTE — Progress Notes (Signed)
Patient ID: Natasha Mitchell, female   DOB: [birth year], 51 y.o.   MRN: [id number]   Subjective:   Natasha Mitchell is a 51 y.o. female here for a complete physical exam  Interim issues since last visit: had surgery on left foot; cast is off, using scooter for ambuation; no fevers  Lab Results  Component Value Date   HGBA1C 6.5 07/06/2017    USPSTF grade A and B recommendations Depression:  Depression screen [facility] 2/9 08/12/2017 07/06/2017 03/18/2017 08/10/2016 02/18/2016  Decreased Interest 0 0 0 0 0  Down, Depressed, Hopeless 0 0 0 0 0  PHQ - 2 Score 0 0 0 0 0   Hypertension: in pain BP Readings from Last 3 Encounters:  08/12/17 132/74  07/14/17 (!) 154/99  07/06/17 138/82   Obesity: lost 6 pounds right before surgery; doing better off of prozac and meloxicam, fluid retention; legs done feel tight any more off of those medicines Wt Readings from Last 3 Encounters:  08/12/17 229 lb (103.9 kg)  07/06/17 229 lb (103.9 kg)  05/07/17 219 lb (99.3 kg)   BMI Readings from Last 3 Encounters:  08/12/17 39.31 kg/m  07/06/17 39.31 kg/m  05/07/17 37.59 kg/m    Skin cancer: no worrisome moles; unchanged, indurated, hyperpigmented on right arm and right leg Lung cancer:  n/a Breast cancer: no lumps; mammogram 3D done this year Colorectal cancer: UTD; went back  BRCA gene screening: family hx of breast and/or ovarian cancer and/or metastatic prostate cancer? no Cervical cancer screening: s/p hyst HIV, hep B, hep C: will get today STD testing and prevention (chl/gon/syphilis): will get today Intimate partner violence: no abuse Contraception: n/a Osteoporosis: n/a Fall prevention/vitamin D: discussed; taking D3  Diet: getting calcium, fiber (may need more), may get supplement Exercise: right now, limited because of surgery Alcohol: 3 drinks a week before surgery; not mixing withj pain med Tobacco use: n/a Aspirin:taking for DVT prevention Lipids:  Lab Results  Component  Value Date   CHOL 237 (H) 03/19/2017   CHOL 237 (H) 08/10/2016   CHOL 188 06/18/2015   Lab Results  Component Value Date   HDL 53 03/19/2017   HDL 51 08/10/2016   HDL 62 06/18/2015   Lab Results  Component Value Date   LDLCALC 160 (H) 03/19/2017   LDLCALC 162 (H) 08/10/2016   LDLCALC 105 (H) 06/18/2015   Lab Results  Component Value Date   TRIG 122 03/19/2017   TRIG 122 08/10/2016   TRIG 107 06/18/2015   Lab Results  Component Value Date   CHOLHDL 4.5 03/19/2017   CHOLHDL 4.6 08/10/2016   CHOLHDL 3.0 06/18/2015   No results found for: LDLDIRECT Glucose:  Glucose  Date Value Ref Range Status  08/21/2014 110 (H) 65 - 99 mg/dL Final   Glucose, Bld  Date Value Ref Range Status  03/19/2017 117 (H) 65 - 99 mg/dL Final  02/04/2017 114 (H) 65 - 99 mg/dL Final  08/10/2016 101 (H) 65 - 99 mg/dL Final   Glucose-Capillary  Date Value Ref Range Status  05/07/2017 114 (H) 65 - 99 mg/dL Final  02/18/2017 105 (H) 65 - 99 mg/dL Final    Past Medical History:  Diagnosis Date  . Anemia    H/O PRIOR TO HYSTERECTOMY  . Anxiety   . Arthritis   . Family hx of colon cancer 02/15/2016  . Headache   . History of uterine fibroid    s/p abdominal hysterectomy with LSO and bilateral salpingectomy 03/2016  .  Hypertension   . Intermittent palpitations    SINCE TAKING VERAPAMIL AND DECREASING CAFFEINE INTAKE, PALITATIONS ARE VERY MINIMAL PER PT (02-03-17)  . Prediabetes   . Type 2 diabetes mellitus ([place]) 07/06/2017  . Vein, varicose 2016   Past Surgical History:  Procedure Laterality Date  . ABDOMINAL HYSTERECTOMY  04/26/2016  . ABLATION SAPHENOUS VEIN W/ RFA Bilateral    femoral vein ( vein treatment center)  . BUNIONECTOMY Left 07/09/2017  . COLONIC ADENOMA     HIGH GRADE DYSPLASIA  . HAMMER TOE SURGERY Left 07/09/2017  . MOUTH SURGERY  2001  . TUBAL LIGATION  1998   Family History  Problem Relation Age of Onset  . Birth defects Mother        ablation in heart  .  Hearing loss Father   . Diabetes Maternal Grandmother   . Heart disease Maternal Grandfather   . Breast cancer Neg Hx    Social History   Tobacco Use  . Smoking status: Former Smoker    Packs/day: 1.50    Years: 15.00    Pack years: 22.50    Types: Cigarettes    Last attempt to quit: 10/06/1999    Years since quitting: 17.8  . Smokeless tobacco: Never Used  . Tobacco comment: quit in 2001  Substance Use Topics  . Alcohol use: Yes    Alcohol/week: 1.8 - 2.4 oz    Types: 1 Glasses of wine, 1 Cans of beer, 1 - 2 Standard drinks or equivalent per week    Comment: occasional beer or wine   Review of Systems  Objective:   Vitals:   08/12/17 0827 08/12/17 0829  BP: 138/72 132/74  Pulse: 86   Resp: 14   Temp: 97.8 F (36.6 C)   TempSrc: Oral   SpO2: 99%   Weight: 229 lb (103.9 kg)   Height: _0  (1.626 m)    Body mass index is 39.31 kg/m. Wt Readings from Last 3 Encounters:  08/12/17 229 lb (103.9 kg)  07/06/17 229 lb (103.9 kg)  05/07/17 219 lb (99.3 kg)   Physical Exam  Constitutional: She appears well-developed and well-nourished.  HENT:  Head: Normocephalic and atraumatic.  Right Ear: Hearing, tympanic membrane, external ear and ear canal normal.  Left Ear: Hearing, tympanic membrane, external ear and ear canal normal.  Eyes: Conjunctivae and EOM are normal. Right eye exhibits no hordeolum. Left eye exhibits no hordeolum. No scleral icterus.  Neck: Carotid bruit is not present. No thyromegaly present.  Cardiovascular: Normal rate, regular rhythm, S1 normal, S2 normal and normal heart sounds.  No extrasystoles are present.  Pulmonary/Chest: Effort normal and breath sounds normal. No respiratory distress. Right breast exhibits no inverted nipple, no mass, no nipple discharge, no skin change and no tenderness. Left breast exhibits no inverted nipple, no mass, no nipple discharge, no skin change and no tenderness. Breasts are symmetrical.  Abdominal: Soft. Normal  appearance and bowel sounds are normal. She exhibits no distension, no abdominal bruit, no pulsatile midline mass and no mass. There is no hepatosplenomegaly. There is no tenderness. No hernia.  Musculoskeletal: Normal range of motion. She exhibits no edema.       Feet:  Surgical scar, mild edema; no fluctuance, no erythema LEFT foot; hyperpigmentation of the toes of the left foot  Lymphadenopathy:       Head (right side): No submandibular adenopathy present.       Head (left side): No submandibular adenopathy present.    She has  no cervical adenopathy.    She has no axillary adenopathy.  Neurological: She is alert. She displays no tremor. No cranial nerve deficit. Gait normal.  Decreased muscle mass lower left leg  Skin: Skin is warm and dry. No bruising and no ecchymosis noted. No cyanosis. No pallor.  Psychiatric: Her speech is normal and behavior is normal. Thought content normal. Her mood appears not anxious. She does not exhibit a depressed mood.   Assessment/Plan:   Problem List Items Addressed This Visit      Endocrine   Type 2 diabetes mellitus ([place])   Relevant Orders   Microalbumin / creatinine urine ratio     Other   Screen for STD (sexually transmitted disease)    Screening for low risk patient      Relevant Orders   Hepatitis panel, acute   RPR   HIV antibody (with reflex)   C. trachomatis/N. gonorrhoeae RNA   Preventative health care - Primary    USPSTF grade A and B recommendations reviewed with patient; age-appropriate recommendations, preventive care, screening tests, etc discussed and encouraged; healthy living encouraged; see AVS for patient education given to patient       Relevant Orders   CBC with Differential/Platelet   COMPLETE METABOLIC PANEL WITH GFR   Lipid panel   TSH   Pneumococcal vaccination indicated    PPSV-23 recommended and given today; next booster due after 65th birthday          No orders of the defined types were placed in this  encounter.  Orders Placed This Encounter  Procedures  . C. trachomatis/N. gonorrhoeae RNA  . Pneumococcal polysaccharide vaccine 23-valent greater than or equal to 2yo subcutaneous/IM  . CBC with Differential/Platelet  . COMPLETE METABOLIC PANEL WITH GFR  . Lipid panel  . TSH  . Hepatitis panel, acute  . RPR  . HIV antibody (with reflex)  . Microalbumin / creatinine urine ratio    Follow up plan: Return in about 1 year (around 08/12/2018) for complete physical.  An After Visit Summary was printed and given to the patient.

## 2017-08-13 ENCOUNTER — Encounter: Payer: Self-pay | Admitting: Podiatry

## 2017-08-13 LAB — LIPID PANEL
Cholesterol: 250 mg/dL — ABNORMAL HIGH (ref <200)
HDL: 59 mg/dL (ref >50)
LDL Cholesterol (Calc): 160 mg/dL (calc) — ABNORMAL HIGH
Non-HDL Cholesterol (Calc): 191 mg/dL (calc) — ABNORMAL HIGH (ref <130)
Total CHOL/HDL Ratio: 4.2 (calc) (ref <5.0)
Triglycerides: 162 mg/dL — ABNORMAL HIGH (ref <150)

## 2017-08-13 LAB — HEPATITIS PANEL, ACUTE
Hep A IgM: NONREACTIVE
Hep B C IgM: NONREACTIVE
Hepatitis B Surface Ag: NONREACTIVE
Hepatitis C Ab: NONREACTIVE
SIGNAL TO CUT-OFF: 0.03 (ref <1.00)

## 2017-08-13 LAB — COMPLETE METABOLIC PANEL WITH GFR
AG RATIO: 1.3 (calc) (ref 1.0–2.5)
ALBUMIN MSPROF: 4.1 g/dL (ref 3.6–5.1)
ALT: 18 U/L (ref 6–29)
AST: 20 U/L (ref 10–35)
Alkaline phosphatase (APISO): 118 U/L (ref 33–130)
BUN: 15 mg/dL (ref 7–25)
CALCIUM: 9.7 mg/dL (ref 8.6–10.4)
CO2: 32 mmol/L (ref 20–32)
CREATININE: 0.75 mg/dL (ref 0.50–1.05)
Chloride: 99 mmol/L (ref 98–110)
GFR, EST AFRICAN AMERICAN: 108 mL/min/{1.73_m2} (ref >=60)
GFR, EST NON AFRICAN AMERICAN: 93 mL/min/{1.73_m2} (ref >=60)
GLOBULIN: 3.1 g/dL (ref 1.9–3.7)
Glucose, Bld: 115 mg/dL — ABNORMAL HIGH (ref 65–99)
POTASSIUM: 3.8 mmol/L (ref 3.5–5.3)
SODIUM: 138 mmol/L (ref 135–146)
TOTAL PROTEIN: 7.2 g/dL (ref 6.1–8.1)
Total Bilirubin: 0.5 mg/dL (ref 0.2–1.2)

## 2017-08-13 LAB — CBC WITH DIFFERENTIAL/PLATELET
Basophils Absolute: 28 cells/uL (ref 0–200)
Basophils Relative: 0.5 %
Eosinophils Absolute: 202 cells/uL (ref 15–500)
Eosinophils Relative: 3.6 %
HEMATOCRIT: 39.8 % (ref 35.0–45.0)
HEMOGLOBIN: 12.6 g/dL (ref 11.7–15.5)
LYMPHS ABS: 1999 {cells}/uL (ref 850–3900)
MCH: 22.3 pg — ABNORMAL LOW (ref 27.0–33.0)
MCHC: 31.7 g/dL — ABNORMAL LOW (ref 32.0–36.0)
MCV: 70.4 fL — AB (ref 80.0–100.0)
MPV: 9.6 fL (ref 7.5–12.5)
Monocytes Relative: 9.5 %
NEUTROS ABS: 2839 {cells}/uL (ref 1500–7800)
NEUTROS PCT: 50.7 %
Platelets: 420 10*3/uL — ABNORMAL HIGH (ref 140–400)
RBC: 5.65 10*6/uL — AB (ref 3.80–5.10)
RDW: 16.5 % — AB (ref 11.0–15.0)
Total Lymphocyte: 35.7 %
WBC: 5.6 10*3/uL (ref 3.8–10.8)
WBCMIX: 532 {cells}/uL (ref 200–950)

## 2017-08-13 LAB — RPR: RPR Ser Ql: NONREACTIVE

## 2017-08-13 LAB — HIV ANTIBODY (ROUTINE TESTING W REFLEX): HIV 1&2 Ab, 4th Generation: NONREACTIVE

## 2017-08-13 LAB — TSH: TSH: 0.66 m[IU]/L

## 2017-08-13 LAB — MICROALBUMIN / CREATININE URINE RATIO
CREATININE, URINE: 176 mg/dL (ref 20–275)
MICROALB UR: 0.7 mg/dL
Microalb Creat Ratio: 4 mcg/mg creat (ref <30)

## 2017-08-13 LAB — C. TRACHOMATIS/N. GONORRHOEAE RNA
C. trachomatis RNA, TMA: NOT DETECTED
N. gonorrhoeae RNA, TMA: NOT DETECTED

## 2017-08-18 ENCOUNTER — Ambulatory Visit (INDEPENDENT_AMBULATORY_CARE_PROVIDER_SITE_OTHER): Payer: 59 | Admitting: Podiatry

## 2017-08-18 ENCOUNTER — Encounter: Payer: Self-pay | Admitting: Podiatry

## 2017-08-18 ENCOUNTER — Ambulatory Visit (INDEPENDENT_AMBULATORY_CARE_PROVIDER_SITE_OTHER): Payer: 59

## 2017-08-18 DIAGNOSIS — M2042 Other hammer toe(s) (acquired), left foot: Secondary | ICD-10-CM | POA: Diagnosis not present

## 2017-08-18 DIAGNOSIS — M2012 Hallux valgus (acquired), left foot: Secondary | ICD-10-CM

## 2017-08-18 NOTE — Progress Notes (Signed)
She presents today status post Austin bunionectomy hammertoe repairs had a Kidner procedure. She states this doing much better she still has some tingling around the medial arch and toe.  Objective: Vital signs are stable she is alert and oriented 3. 6 weeks out at this point and doing very well. Mild edema about the ankle.  Assessment: Well-healing surgical foot left.  Plan: I'm going to encourage range of motion exercises only encourage partial weightbearing. I'll follow-up with her in 3-4 weeks at which time we will consider physical therapy at necessary.

## 2017-09-08 ENCOUNTER — Ambulatory Visit (INDEPENDENT_AMBULATORY_CARE_PROVIDER_SITE_OTHER): Payer: 59

## 2017-09-08 ENCOUNTER — Encounter: Payer: Self-pay | Admitting: Podiatry

## 2017-09-08 ENCOUNTER — Ambulatory Visit (INDEPENDENT_AMBULATORY_CARE_PROVIDER_SITE_OTHER): Payer: 59 | Admitting: Podiatry

## 2017-09-08 DIAGNOSIS — M2012 Hallux valgus (acquired), left foot: Secondary | ICD-10-CM

## 2017-09-08 DIAGNOSIS — M2042 Other hammer toe(s) (acquired), left foot: Secondary | ICD-10-CM

## 2017-09-08 NOTE — Progress Notes (Signed)
She presents today for follow-up of her Austin bunionectomy hammertoe repair and Kidner procedure left foot.  She states that still have not been standing.  She states that I am nervous to stand I feel that I may injure something.  Objective: Vital signs are stable she is alert and oriented x3 presents with her cam walker and her knee scooter today once removed demonstrates minimal edema no erythema cellulitis drainage or odor incision site is gone on to heal uneventfully great range of motion of all of the toes at the metatarsophalangeal joints good inversion against resistance.  Radiographs: 3 views left foot taken today demonstrates a Kidner in good position [employer] repair and hammertoe repairs with screws all in good position.    Assessment: Well-healing surgical foot times 2 months left.  Plan: Place her in a compression anklet a Tri-Lock brace and provided her with a prescription for physical therapy.

## 2017-09-15 ENCOUNTER — Telehealth: Payer: Self-pay | Admitting: Podiatry

## 2017-09-15 NOTE — Telephone Encounter (Signed)
Good Morning,  Ms. Chelci Ricketson called this morning and left a voicemail in regards to a Doctors note she's needing. Says she'll be in therapy starting Friday December 14th and will be out for 4 weeks and her job is in need of a note for that. She wants the note sent to Mr. Marvel Plan at [facility] and the fax number for him is [phone].   Thank you, Domenick Gong

## 2017-09-17 DIAGNOSIS — M2042 Other hammer toe(s) (acquired), left foot: Secondary | ICD-10-CM

## 2017-09-17 DIAGNOSIS — M25572 Pain in left ankle and joints of left foot: Secondary | ICD-10-CM | POA: Diagnosis not present

## 2017-09-22 DIAGNOSIS — M2042 Other hammer toe(s) (acquired), left foot: Secondary | ICD-10-CM | POA: Diagnosis not present

## 2017-09-22 DIAGNOSIS — M25572 Pain in left ankle and joints of left foot: Secondary | ICD-10-CM | POA: Diagnosis not present

## 2017-09-30 ENCOUNTER — Encounter: Payer: Self-pay | Admitting: Podiatry

## 2017-09-30 DIAGNOSIS — M2042 Other hammer toe(s) (acquired), left foot: Secondary | ICD-10-CM

## 2017-10-01 ENCOUNTER — Telehealth: Payer: Self-pay | Admitting: Podiatry

## 2017-10-01 NOTE — Telephone Encounter (Signed)
This is Dustin Folks with [facility]. I'm trying to contact the doctors office about this pt. She is currently receiving a disability income for disability where the doctor had her out of work. We are just trying to follow up. We had an expected return to work status for 28 December which is today. Trying to find out when she saw the doctor last and if she has actually been released to return to work as we don't want to create another claim or any over payments. My direct number is [phone] if somebody could give me a call back today that would be appreciated. We have been trying to contact the pt but cannot get in touch with her. Thanks and have a great day.

## 2017-10-06 DIAGNOSIS — M2042 Other hammer toe(s) (acquired), left foot: Secondary | ICD-10-CM

## 2017-10-08 DIAGNOSIS — M2042 Other hammer toe(s) (acquired), left foot: Secondary | ICD-10-CM | POA: Diagnosis not present

## 2017-10-08 DIAGNOSIS — M25572 Pain in left ankle and joints of left foot: Secondary | ICD-10-CM | POA: Diagnosis not present

## 2017-10-11 ENCOUNTER — Encounter: Payer: Self-pay | Admitting: Family Medicine

## 2017-10-12 DIAGNOSIS — M2042 Other hammer toe(s) (acquired), left foot: Secondary | ICD-10-CM | POA: Diagnosis not present

## 2017-10-12 DIAGNOSIS — M25572 Pain in left ankle and joints of left foot: Secondary | ICD-10-CM | POA: Diagnosis not present

## 2017-10-13 ENCOUNTER — Ambulatory Visit (INDEPENDENT_AMBULATORY_CARE_PROVIDER_SITE_OTHER): Payer: 59

## 2017-10-13 ENCOUNTER — Ambulatory Visit (INDEPENDENT_AMBULATORY_CARE_PROVIDER_SITE_OTHER): Payer: 59 | Admitting: Podiatry

## 2017-10-13 ENCOUNTER — Encounter: Payer: Self-pay | Admitting: Podiatry

## 2017-10-13 DIAGNOSIS — M2042 Other hammer toe(s) (acquired), left foot: Secondary | ICD-10-CM

## 2017-10-13 DIAGNOSIS — M2012 Hallux valgus (acquired), left foot: Secondary | ICD-10-CM | POA: Diagnosis not present

## 2017-10-13 NOTE — Progress Notes (Signed)
She presents today for follow-up of her [place] bunionectomy hammertoe repair 2 through 5 of the left foot as well as a posterior tibial tendon repair.  She states that everything is doing great with exception of the medial aspect of the ankle states that she is continuing physical therapy.  Objective: Vital signs are stable alert and oriented x3 there is moderate edema and erythema to the medial aspect of the left ankle is good inversion against resistance.  Very minimal in the way of reproduction of pain on palpation.  Surgical site on the forefoot have gone on to heal uneventfully.  Assessment: Well-healing surgical foot slowly progressively getting better.  Plan: Follow-up with her in 1 month continue physical therapy.

## 2017-10-14 DIAGNOSIS — H1132 Conjunctival hemorrhage, left eye: Secondary | ICD-10-CM | POA: Diagnosis not present

## 2017-10-15 DIAGNOSIS — M25572 Pain in left ankle and joints of left foot: Secondary | ICD-10-CM | POA: Diagnosis not present

## 2017-10-15 DIAGNOSIS — M2042 Other hammer toe(s) (acquired), left foot: Secondary | ICD-10-CM | POA: Diagnosis not present

## 2017-10-20 DIAGNOSIS — M25572 Pain in left ankle and joints of left foot: Secondary | ICD-10-CM | POA: Diagnosis not present

## 2017-10-20 DIAGNOSIS — M2042 Other hammer toe(s) (acquired), left foot: Secondary | ICD-10-CM | POA: Diagnosis not present

## 2017-11-05 DIAGNOSIS — M2042 Other hammer toe(s) (acquired), left foot: Secondary | ICD-10-CM | POA: Diagnosis not present

## 2017-11-05 DIAGNOSIS — M25572 Pain in left ankle and joints of left foot: Secondary | ICD-10-CM | POA: Diagnosis not present

## 2017-11-10 DIAGNOSIS — M2042 Other hammer toe(s) (acquired), left foot: Secondary | ICD-10-CM | POA: Diagnosis not present

## 2017-11-10 DIAGNOSIS — M25572 Pain in left ankle and joints of left foot: Secondary | ICD-10-CM | POA: Diagnosis not present

## 2017-11-17 ENCOUNTER — Ambulatory Visit: Payer: 59 | Admitting: Podiatry

## 2017-11-24 ENCOUNTER — Ambulatory Visit (INDEPENDENT_AMBULATORY_CARE_PROVIDER_SITE_OTHER): Payer: 59

## 2017-11-24 ENCOUNTER — Other Ambulatory Visit: Payer: Self-pay | Admitting: Podiatry

## 2017-11-24 ENCOUNTER — Encounter: Payer: Self-pay | Admitting: Podiatry

## 2017-11-24 ENCOUNTER — Ambulatory Visit (INDEPENDENT_AMBULATORY_CARE_PROVIDER_SITE_OTHER): Payer: 59 | Admitting: Podiatry

## 2017-11-24 DIAGNOSIS — M2042 Other hammer toe(s) (acquired), left foot: Secondary | ICD-10-CM | POA: Diagnosis not present

## 2017-11-24 DIAGNOSIS — M2012 Hallux valgus (acquired), left foot: Secondary | ICD-10-CM

## 2017-11-24 DIAGNOSIS — Z9889 Other specified postprocedural states: Secondary | ICD-10-CM

## 2017-11-24 NOTE — Progress Notes (Signed)
   F/O eval/cast: Left 4* medial kirby skive, scaphoid pad, 2* ff valgus post.     She presents today for follow-up of her surgical foot left.  Her hammertoe and her bunion deformities performed back in July 09, 2017 doing very well.  She still having some tenderness along the posterior tibial tendon.  Physical therapy has improved her considerably however she still has soreness and fatigue in the area.  Objective: Vital signs are stable she is alert and oriented x3.  Pulses are palpable.  Tenderness on palpation of the posterior tibial tendon but much improved and much less edema.  Forefoot has gone on to heal uneventfully.  Radiographs taken today demonstrate a well-healing surgical foot without complications.  Internal fixation is in good position.  Much decrease in edema along the medial aspect of the ankle and heel.  Assessment: Well-healing surgical foot left.  Slowly resolving posterior tibial tendinitis  Plan: I think the best thing for her at this point he did get her into a new set of orthotics so she is Natasha Mitchell today and was casted for new orthotics.

## 2017-11-26 IMAGING — DX DG CHEST 2V
2 series · 2 of 2 positions shown · non-contrast
Comparison: None available

CLINICAL DATA: Productive cough with green mucous, SOB x1 week.
Right side chest pain that began during a coughing fit x1 day. Pt
began antibiotics x5 days ago and is not improving. Hx of HTN.
Ex-smoker, quit x16 years ago.

EXAM:
CHEST - 2 VIEW

[w chest pa]
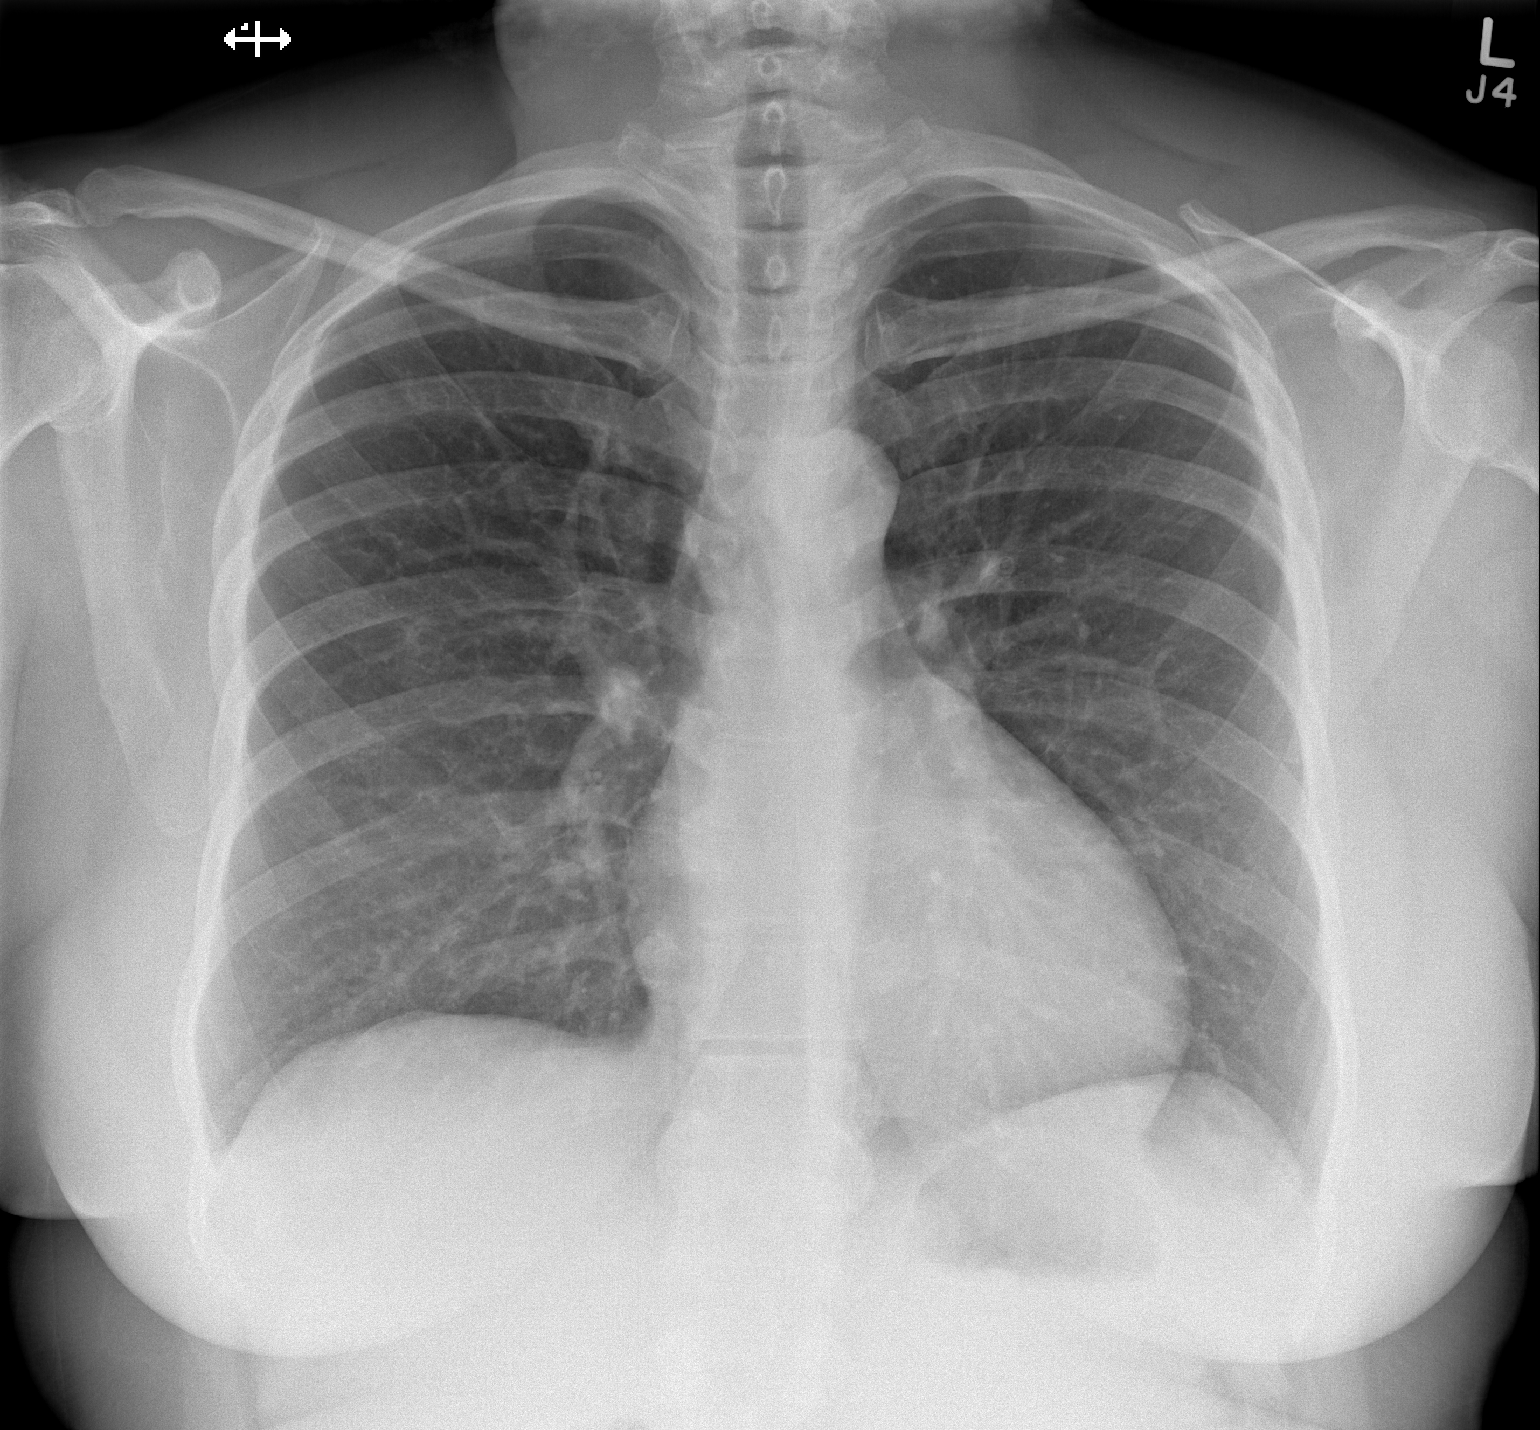

[w chest lat]
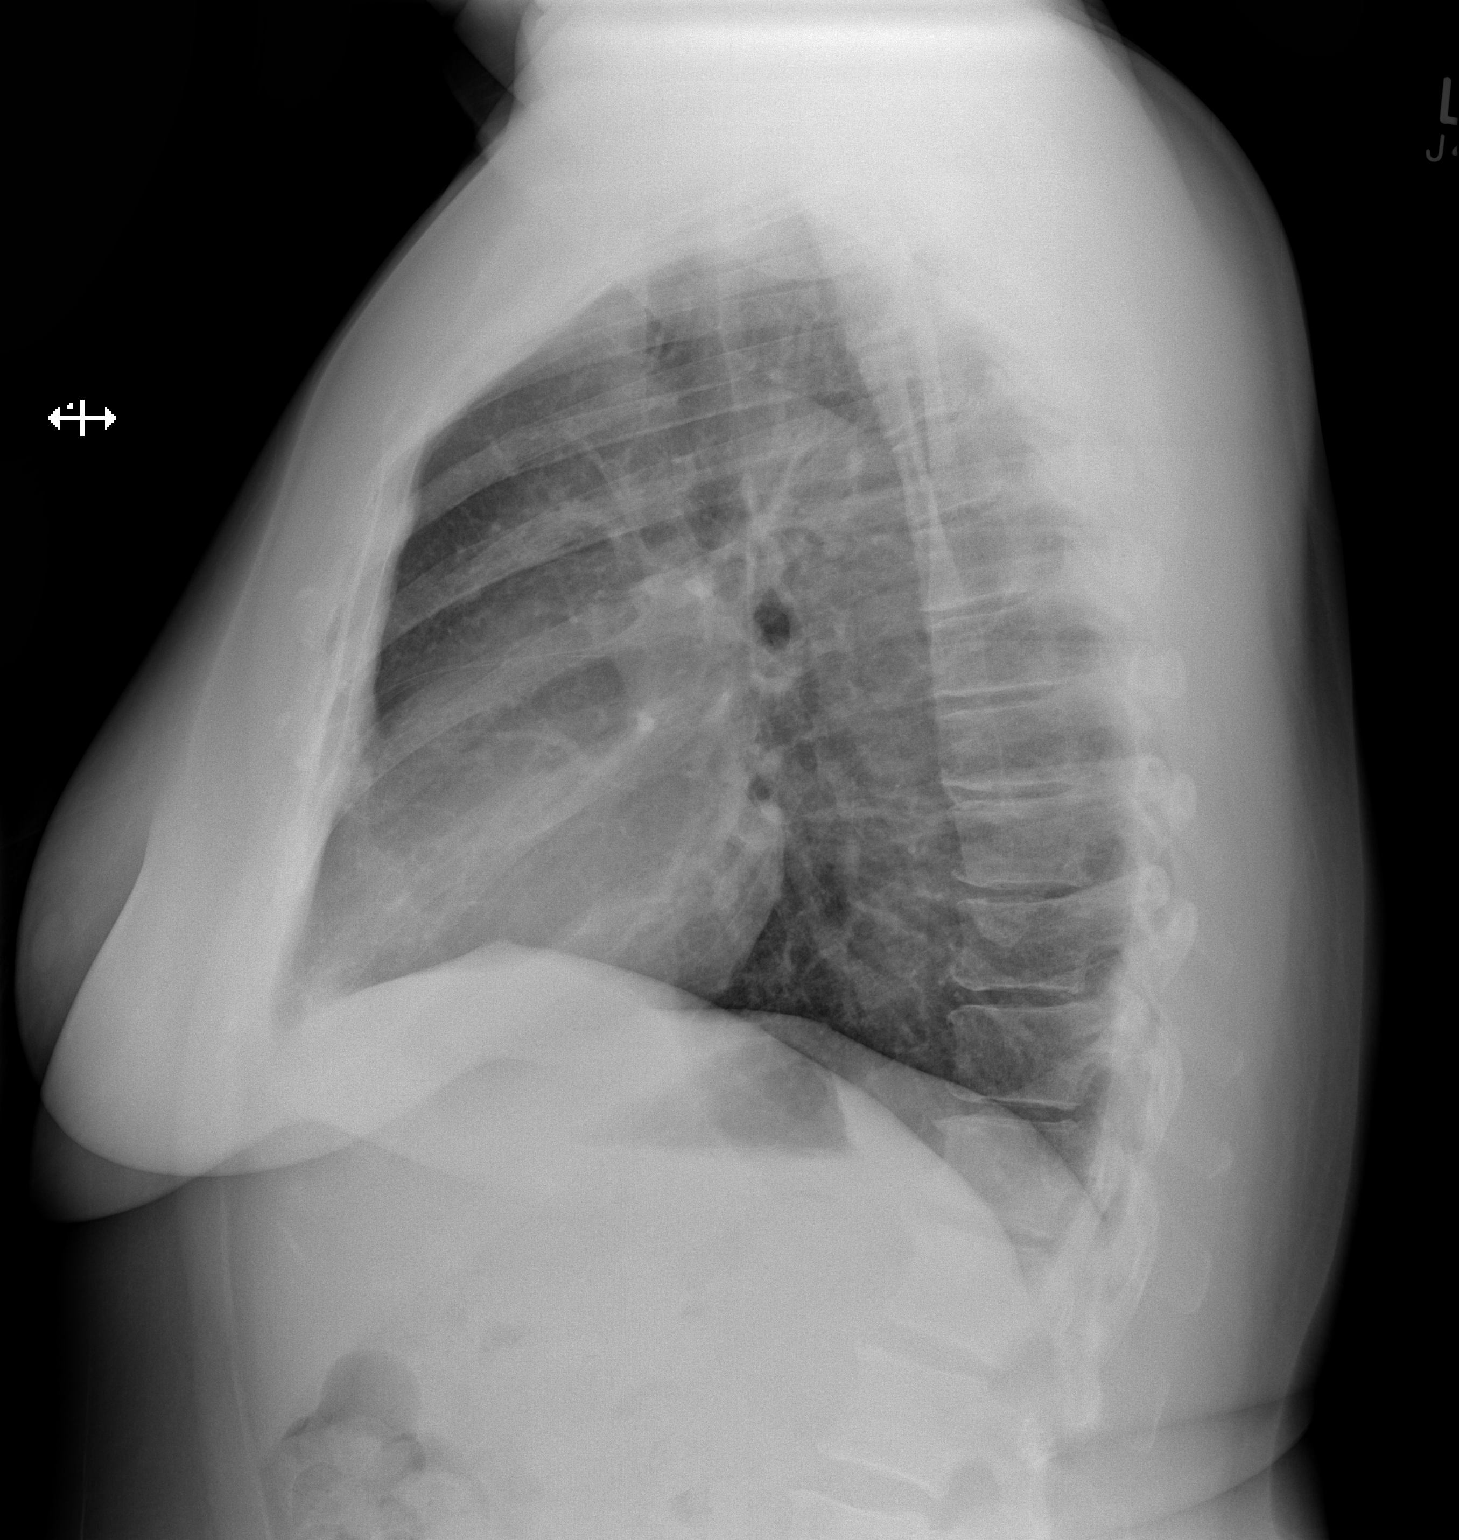

[2 of 2 positions shown; findings below may reference images not displayed]

FINDINGS: Lungs are clear. Heart size and mediastinal contours are within
normal limits.
No effusion.  No pneumothorax.
Visualized skeletal structures are unremarkable.
IMPRESSION: No acute cardiopulmonary disease.

## 2017-11-29 ENCOUNTER — Telehealth: Payer: Self-pay | Admitting: Podiatry

## 2017-11-29 ENCOUNTER — Encounter: Payer: Self-pay | Admitting: Podiatry

## 2017-11-29 NOTE — Telephone Encounter (Signed)
Patient called wanting an updated out of work letter to continue out of work until ??? Per notes from last visit on 11/24/17- Dr. Milinda Pointer did not ask Natasha Mitchell to write a new work note or document in the notes. Please advise what you would like for me to put into this letter. Patient needs this faxed to [facility] today.

## 2017-11-29 NOTE — Telephone Encounter (Signed)
Patient called wanting an updated out of work letter to continue out of work until ??? Per notes from last visit on 11/24/17- Dr. Milinda Pointer did not ask Natasha Mitchell to write a new work note or document in the notes. Please advise what you would like for me to put into this letter. Patient needs this faxed to [facility] today.

## 2017-11-30 NOTE — Telephone Encounter (Signed)
She will be out at least until her next follow up visit.

## 2017-12-17 ENCOUNTER — Other Ambulatory Visit: Payer: Self-pay | Admitting: Family Medicine

## 2017-12-17 DIAGNOSIS — I10 Essential (primary) hypertension: Secondary | ICD-10-CM

## 2017-12-20 ENCOUNTER — Ambulatory Visit (INDEPENDENT_AMBULATORY_CARE_PROVIDER_SITE_OTHER): Payer: 59 | Admitting: Podiatry

## 2017-12-20 ENCOUNTER — Encounter: Payer: Self-pay | Admitting: Podiatry

## 2017-12-20 DIAGNOSIS — M722 Plantar fascial fibromatosis: Secondary | ICD-10-CM | POA: Diagnosis not present

## 2017-12-20 DIAGNOSIS — Z9889 Other specified postprocedural states: Secondary | ICD-10-CM | POA: Diagnosis not present

## 2017-12-20 DIAGNOSIS — M2012 Hallux valgus (acquired), left foot: Secondary | ICD-10-CM

## 2017-12-20 DIAGNOSIS — M2042 Other hammer toe(s) (acquired), left foot: Secondary | ICD-10-CM

## 2017-12-20 NOTE — Progress Notes (Signed)
She presents today for follow-up of her [facility] bunion repair hammertoe repair and posterior tibial tendon repair.  States that she still has a lot of swelling on the side she would like to pick up her orthotics today.  She states that the foot is feeling much better now that she has stopped doing so much physical therapy all of the time.  Objective: Vital signs are stable alert oriented x3.  Pulses are palpable.  Neurologic sensorium is intact much decrease in edema on medial aspect of the foot from previous evaluations.  Assessment: Well-healing surgical foot.  Plan: Dispensed orthotics was given both oral and written home-going instructions for the care and use today I will follow-up with her in 1 month to 6 weeks.

## 2017-12-20 NOTE — Patient Instructions (Signed)

## 2018-02-02 ENCOUNTER — Other Ambulatory Visit: Payer: Self-pay | Admitting: Family Medicine

## 2018-02-02 ENCOUNTER — Ambulatory Visit: Payer: 59 | Admitting: Podiatry

## 2018-02-02 DIAGNOSIS — M25561 Pain in right knee: Secondary | ICD-10-CM | POA: Diagnosis not present

## 2018-02-02 DIAGNOSIS — M1711 Unilateral primary osteoarthritis, right knee: Secondary | ICD-10-CM | POA: Insufficient documentation

## 2018-02-02 DIAGNOSIS — G8929 Other chronic pain: Secondary | ICD-10-CM | POA: Insufficient documentation

## 2018-02-03 NOTE — Telephone Encounter (Signed)
[phone]  Left voice message on [phone] informing pt that prescription has been sent to pharmacy, however she is needing to schedule appt within the next month with Dr Sanda Klein or Benjamine Mola. Thank you

## 2018-02-03 NOTE — Telephone Encounter (Signed)
Sending one month, but needs to see Dr. Sanda Klein for more refills

## 2018-02-07 ENCOUNTER — Other Ambulatory Visit: Payer: Self-pay | Admitting: Family Medicine

## 2018-02-07 ENCOUNTER — Ambulatory Visit (INDEPENDENT_AMBULATORY_CARE_PROVIDER_SITE_OTHER): Payer: 59 | Admitting: Podiatry

## 2018-02-07 ENCOUNTER — Encounter: Payer: Self-pay | Admitting: Podiatry

## 2018-02-07 ENCOUNTER — Encounter: Payer: Self-pay | Admitting: Family Medicine

## 2018-02-07 ENCOUNTER — Other Ambulatory Visit: Payer: Self-pay

## 2018-02-07 ENCOUNTER — Ambulatory Visit (INDEPENDENT_AMBULATORY_CARE_PROVIDER_SITE_OTHER): Payer: 59 | Admitting: Family Medicine

## 2018-02-07 ENCOUNTER — Ambulatory Visit
Admission: RE | Admit: 2018-02-07 | Discharge: 2018-02-07 | Disposition: A | Discharge status: Home / Self Care | Payer: 59 | Source: Ambulatory Visit | Attending: Family Medicine | Admitting: Family Medicine

## 2018-02-07 VITALS — BP 154/86 | HR 97 | Temp 98.5°F | Resp 16 | Ht 64.0 in | Wt 229.8 lb

## 2018-02-07 DIAGNOSIS — M76829 Posterior tibial tendinitis, unspecified leg: Secondary | ICD-10-CM

## 2018-02-07 DIAGNOSIS — Z794 Long term (current) use of insulin: Secondary | ICD-10-CM

## 2018-02-07 DIAGNOSIS — R718 Other abnormality of red blood cells: Secondary | ICD-10-CM | POA: Diagnosis not present

## 2018-02-07 DIAGNOSIS — Z1231 Encounter for screening mammogram for malignant neoplasm of breast: Secondary | ICD-10-CM | POA: Insufficient documentation

## 2018-02-07 DIAGNOSIS — Z1239 Encounter for other screening for malignant neoplasm of breast: Secondary | ICD-10-CM

## 2018-02-07 DIAGNOSIS — Z9889 Other specified postprocedural states: Secondary | ICD-10-CM | POA: Diagnosis not present

## 2018-02-07 DIAGNOSIS — I10 Essential (primary) hypertension: Secondary | ICD-10-CM

## 2018-02-07 DIAGNOSIS — E785 Hyperlipidemia, unspecified: Secondary | ICD-10-CM | POA: Diagnosis not present

## 2018-02-07 DIAGNOSIS — M2042 Other hammer toe(s) (acquired), left foot: Secondary | ICD-10-CM | POA: Diagnosis not present

## 2018-02-07 DIAGNOSIS — M2012 Hallux valgus (acquired), left foot: Secondary | ICD-10-CM

## 2018-02-07 DIAGNOSIS — D509 Iron deficiency anemia, unspecified: Secondary | ICD-10-CM | POA: Diagnosis not present

## 2018-02-07 DIAGNOSIS — E118 Type 2 diabetes mellitus with unspecified complications: Secondary | ICD-10-CM

## 2018-02-07 DIAGNOSIS — R232 Flushing: Secondary | ICD-10-CM

## 2018-02-07 DIAGNOSIS — Z5181 Encounter for therapeutic drug level monitoring: Secondary | ICD-10-CM

## 2018-02-07 MED ORDER — VERAPAMIL HCL ER 240 MG PO CP24: 240.0000 mg | ORAL_CAPSULE | Freq: Every day | ORAL | 11 refills | Status: DC

## 2018-02-07 MED ORDER — CITALOPRAM HYDROBROMIDE 20 MG PO TABS: 20.0000 mg | ORAL_TABLET | Freq: Every day | ORAL | 11 refills | Status: AC

## 2018-02-07 NOTE — Progress Notes (Signed)
Lab orders, patient coming back this afternoon

## 2018-02-07 NOTE — Assessment & Plan Note (Signed)
Continue SSRI, working well

## 2018-02-07 NOTE — Telephone Encounter (Signed)
I am not able to close this chart please close

## 2018-02-07 NOTE — Patient Instructions (Addendum)
Just monitor your BP at home and call me if the top number is 130 or higher Check out the information at familydoctor.org entitled "Nutrition for Weight Loss: What You Need to Know about Fad Diets" Try to lose between 1-2 pounds per week by taking in fewer calories and burning off more calories You can succeed by limiting portions, limiting foods dense in calories and fat, becoming more active, and drinking 8 glasses of water a day (64 ounces) Don't skip meals, especially breakfast, as skipping meals may alter your metabolism Do not use over-the-counter weight loss pills or gimmicks that claim rapid weight loss A healthy BMI (or body mass index) is between 18.5 and 24.9 You can calculate your ideal BMI at the [place] website ClubMonetize.fr  DASH Eating Plan DASH stands for "Dietary Approaches to Stop Hypertension." The DASH eating plan is a healthy eating plan that has been shown to reduce high blood pressure (hypertension). It may also reduce your risk for type 2 diabetes, heart disease, and stroke. The DASH eating plan may also help with weight loss. What are tips for following this plan? General guidelines  Avoid eating more than 2,300 mg (milligrams) of salt (sodium) a day. If you have hypertension, you may need to reduce your sodium intake to 1,500 mg a day.  Limit alcohol intake to no more than 1 drink a day for nonpregnant women and 2 drinks a day for men. One drink equals 12 oz of beer, 5 oz of wine, or 1 oz of hard liquor.  Work with your health care provider to maintain a healthy body weight or to lose weight. Ask what an ideal weight is for you.  Get at least 30 minutes of exercise that causes your heart to beat faster (aerobic exercise) most days of the week. Activities may include walking, swimming, or biking.  Work with your health care provider or diet and nutrition specialist (dietitian) to adjust your eating plan to your  individual calorie needs. Reading food labels  Check food labels for the amount of sodium per serving. Choose foods with less than 5 percent of the Daily Value of sodium. Generally, foods with less than 300 mg of sodium per serving fit into this eating plan.  To find whole grains, look for the word "whole" as the first word in the ingredient list. Shopping  Buy products labeled as "low-sodium" or "no salt added."  Buy fresh foods. Avoid canned foods and premade or frozen meals. Cooking  Avoid adding salt when cooking. Use salt-free seasonings or herbs instead of table salt or sea salt. Check with your health care provider or pharmacist before using salt substitutes.  Do not fry foods. Cook foods using healthy methods such as baking, boiling, grilling, and broiling instead.  Cook with heart-healthy oils, such as olive, canola, soybean, or sunflower oil. Meal planning   Eat a balanced diet that includes: ? 5 or more servings of fruits and vegetables each day. At each meal, try to fill half of your plate with fruits and vegetables. ? Up to 6-8 servings of whole grains each day. ? Less than 6 oz of lean meat, poultry, or fish each day. A 3-oz serving of meat is about the same size as a deck of cards. One egg equals 1 oz. ? 2 servings of low-fat dairy each day. ? A serving of nuts, seeds, or beans 5 times each week. ? Heart-healthy fats. Healthy fats called Omega-3 fatty acids are found in foods such as flaxseeds  and coldwater fish, like sardines, salmon, and mackerel.  Limit how much you eat of the following: ? Canned or prepackaged foods. ? Food that is high in trans fat, such as fried foods. ? Food that is high in saturated fat, such as fatty meat. ? Sweets, desserts, sugary drinks, and other foods with added sugar. ? Full-fat dairy products.  Do not salt foods before eating.  Try to eat at least 2 vegetarian meals each week.  Eat more home-cooked food and less restaurant,  buffet, and fast food.  When eating at a restaurant, ask that your food be prepared with less salt or no salt, if possible. What foods are recommended? The items listed may not be a complete list. Talk with your dietitian about what dietary choices are best for you. Grains Whole-grain or whole-wheat bread. Whole-grain or whole-wheat pasta. Brown rice. [place]row. Bulgur. Whole-grain and low-sodium cereals. Pita bread. Low-fat, low-sodium crackers. Whole-wheat flour tortillas. Vegetables Fresh or frozen vegetables (raw, steamed, roasted, or grilled). Low-sodium or reduced-sodium tomato and vegetable juice. Low-sodium or reduced-sodium tomato sauce and tomato paste. Low-sodium or reduced-sodium canned vegetables. Fruits All fresh, dried, or frozen fruit. Canned fruit in natural juice (without added sugar). Meat and other protein foods Skinless chicken or [place]. Ground chicken or [place]. Pork with fat trimmed off. Fish and seafood. Egg whites. Dried beans, peas, or lentils. Unsalted nuts, nut butters, and seeds. Unsalted canned beans. Lean cuts of beef with fat trimmed off. Low-sodium, lean deli meat. Dairy Low-fat (1%) or fat-free (skim) milk. Fat-free, low-fat, or reduced-fat cheeses. Nonfat, low-sodium ricotta or cottage cheese. Low-fat or nonfat yogurt. Low-fat, low-sodium cheese. Fats and oils Soft margarine without trans fats. Vegetable oil. Low-fat, reduced-fat, or light mayonnaise and salad dressings (reduced-sodium). Canola, safflower, olive, soybean, and sunflower oils. Avocado. Seasoning and other foods Herbs. Spices. Seasoning mixes without salt. Unsalted popcorn and pretzels. Fat-free sweets. What foods are not recommended? The items listed may not be a complete list. Talk with your dietitian about what dietary choices are best for you. Grains Baked goods made with fat, such as croissants, muffins, or some breads. Dry pasta or rice meal packs. Vegetables Creamed or fried  vegetables. Vegetables in a cheese sauce. Regular canned vegetables (not low-sodium or reduced-sodium). Regular canned tomato sauce and paste (not low-sodium or reduced-sodium). Regular tomato and vegetable juice (not low-sodium or reduced-sodium). Angie Fava. Olives. Fruits Canned fruit in a light or heavy syrup. Fried fruit. Fruit in cream or butter sauce. Meat and other protein foods Fatty cuts of meat. Ribs. Fried meat. Berniece Salines. Sausage. Bologna and other processed lunch meats. Salami. Fatback. Hotdogs. Bratwurst. Salted nuts and seeds. Canned beans with added salt. Canned or smoked fish. Whole eggs or egg yolks. Chicken or [place] with skin. Dairy Whole or 2% milk, cream, and half-and-half. Whole or full-fat cream cheese. Whole-fat or sweetened yogurt. Full-fat cheese. Nondairy creamers. Whipped toppings. Processed cheese and cheese spreads. Fats and oils Butter. Stick margarine. Lard. Shortening. Ghee. Bacon fat. Tropical oils, such as coconut, palm kernel, or palm oil. Seasoning and other foods Salted popcorn and pretzels. Onion salt, garlic salt, seasoned salt, table salt, and sea salt. Worcestershire sauce. Tartar sauce. Barbecue sauce. Teriyaki sauce. Soy sauce, including reduced-sodium. Steak sauce. Canned and packaged gravies. Fish sauce. Oyster sauce. Cocktail sauce. Horseradish that you find on the shelf. Ketchup. Mustard. Meat flavorings and tenderizers. Bouillon cubes. Hot sauce and Tabasco sauce. Premade or packaged marinades. Premade or packaged taco seasonings. Relishes. Regular salad dressings. Where to  find more information:  National Heart, Lung, and Blood Institute: https://wilson-eaton.com/  American Heart Association: www.heart.org Summary  The DASH eating plan is a healthy eating plan that has been shown to reduce high blood pressure (hypertension). It may also reduce your risk for type 2 diabetes, heart disease, and stroke.  With the DASH eating plan, you should limit salt (sodium)  intake to 2,300 mg a day. If you have hypertension, you may need to reduce your sodium intake to 1,500 mg a day.  When on the DASH eating plan, aim to eat more fresh fruits and vegetables, whole grains, lean proteins, low-fat dairy, and heart-healthy fats.  Work with your health care provider or diet and nutrition specialist (dietitian) to adjust your eating plan to your individual calorie needs. This information is not intended to replace advice given to you by your health care provider. Make sure you discuss any questions you have with your health care provider. Document Released: 09/10/2011 Document Revised: 09/14/2016 Document Reviewed: 09/14/2016 Elsevier Interactive Patient Education  [employer].

## 2018-02-07 NOTE — Assessment & Plan Note (Signed)
BMI >35 with type 2 DM and hyperlipidemia

## 2018-02-07 NOTE — Assessment & Plan Note (Signed)
Check labs today; foot exam by MD

## 2018-02-07 NOTE — Assessment & Plan Note (Addendum)
Not to goal; patient declines adding or increasing medicine at this time; will check it at home and notify me if over 130 mmHg; weight loss and DASH guidelines

## 2018-02-07 NOTE — Progress Notes (Signed)
BP (!) 154/86   Pulse 97   Temp 98.5 F (36.9 C) (Oral)   Resp 16   Ht 5\' 4"  (1.626 m)   Wt 229 lb 12.8 oz (104.2 kg)   LMP 04/01/2016   SpO2 94%   BMI 39.45 kg/m    Subjective:    Patient ID: Natasha Mitchell, female    DOB: March 21, 1966, 52 y.o.   MRN: [id number]  HPI: Natasha Mitchell is a 52 y.o. female  Chief Complaint  Patient presents with  . Follow-up  . Medication Refill    HPI Patient is here for f/u She is walking Mitchell after surgery Blood pressure is higher today; taking less ibuprofen; had the cough with ACE-I; could not any of those Taking verapamil every day; taking that for palpitations; using Omron at home; checking at home, definitely less than 130 She had her labs done this morning, not yet resulted Hurt the right knee, OA, had a cortisone injection just last week Obesity, not added weight despite not being able to be active, could not even do PT until today; drinks 64 ounces of water a day High cholesterol; avoiding fatty meats; seldom has something fried, does not fry foods at home; diet is pretty good Hot flashes; SSRI is working well  Depression screen [facility] 2/9 02/07/2018 08/12/2017 07/06/2017 03/18/2017 08/10/2016  Decreased Interest 0 0 0 0 0  Down, Depressed, Hopeless 0 0 0 0 0  PHQ - 2 Score 0 0 0 0 0    Relevant past medical, surgical, family and social history reviewed Past Medical History:  Diagnosis Date  . Anemia    H/O PRIOR TO HYSTERECTOMY  . Anxiety   . Arthritis   . Family hx of colon cancer 02/15/2016  . Headache   . History of uterine fibroid    s/p abdominal hysterectomy with LSO and bilateral salpingectomy 03/2016  . Hypertension   . Intermittent palpitations    SINCE TAKING VERAPAMIL AND DECREASING CAFFEINE INTAKE, PALITATIONS ARE VERY MINIMAL PER PT (02-03-17)  . Prediabetes   . Type 2 diabetes mellitus ([place]) 07/06/2017  . Vein, varicose 2016   Past Surgical History:  Procedure Laterality Date  . ABDOMINAL  HYSTERECTOMY  04/26/2016  . ABLATION SAPHENOUS VEIN W/ RFA Bilateral    femoral vein ([facility])  . BUNIONECTOMY Left 07/09/2017  . COLONIC ADENOMA     HIGH GRADE DYSPLASIA  . COLONOSCOPY WITH PROPOFOL N/A 02/11/2016   Procedure: COLONOSCOPY WITH PROPOFOL;  Surgeon: Lollie Sails, MD;  Location: [facility] ENDOSCOPY;  Service: Endoscopy;  Laterality: N/A;  . FLEXIBLE SIGMOIDOSCOPY N/A 05/07/2017   Procedure: FLEXIBLE SIGMOIDOSCOPY;  Surgeon: Lollie Sails, MD;  Location: [facility] ENDOSCOPY;  Service: Endoscopy;  Laterality: N/A;  . HAMMER TOE SURGERY Left 07/09/2017  . HYSTERECTOMY ABDOMINAL WITH SALPINGECTOMY Bilateral 04/06/2016   Procedure: HYSTERECTOMY ABDOMINAL WITH BILATERAL SALPINGECTOMY;  Surgeon: Rubie Maid, MD;  Location: [facility] ORS;  Service: Gynecology;  Laterality: Bilateral;  . LIPOMA EXCISION Right 02/18/2017   Procedure: EXCISION LIPOMA ON MID BACK;  Surgeon: Clayburn Pert, MD;  Location: [facility] ORS;  Service: General;  Laterality: Right;  . MOUTH SURGERY  2001  . TUBAL LIGATION  1998   Family History  Problem Relation Age of Onset  . Birth defects Mother        ablation in heart  . Hearing loss Father   . Diabetes Maternal Grandmother   . Heart disease Maternal Grandfather   . Breast cancer Neg Hx  Social History   Tobacco Use  . Smoking status: Former Smoker    Packs/day: 1.50    Years: 15.00    Pack years: 22.50    Types: Cigarettes    Last attempt to quit: 10/06/1999    Years since quitting: 18.3  . Smokeless tobacco: Never Used  . Tobacco comment: quit in 2001  Substance Use Topics  . Alcohol use: Yes    Alcohol/week: 1.8 - 2.4 oz    Types: 1 Glasses of wine, 1 Cans of beer, 1 - 2 Standard drinks or equivalent per week    Comment: occasional beer or wine  . Drug use: No    Interim medical history since last visit reviewed. Allergies and medications reviewed  Review of Systems Per HPI unless specifically indicated above       Objective:    BP (!) 154/86   Pulse 97   Temp 98.5 F (36.9 C) (Oral)   Resp 16   Ht 5\' 4"  (1.626 m)   Wt 229 lb 12.8 oz (104.2 kg)   LMP 04/01/2016   SpO2 94%   BMI 39.45 kg/m   Wt Readings from Last 3 Encounters:  02/07/18 229 lb 12.8 oz (104.2 kg)  08/12/17 229 lb (103.9 kg)  07/06/17 229 lb (103.9 kg)    Physical Exam  Constitutional: She appears well-developed and well-nourished. No distress.  HENT:  Head: Normocephalic and atraumatic.  Eyes: EOM are normal. No scleral icterus.  Neck: No thyromegaly present.  Cardiovascular: Normal rate, regular rhythm and normal heart sounds.  No murmur heard. Pulmonary/Chest: Effort normal and breath sounds normal. No respiratory distress. She has no wheezes.  Abdominal: Soft. Bowel sounds are normal. She exhibits no distension.  Musculoskeletal: Normal range of motion. She exhibits no edema.  Neurological: She is alert. She exhibits normal muscle tone.  Skin: Skin is warm and dry. She is not diaphoretic. No pallor.  Psychiatric: She has a normal mood and affect. Her behavior is normal. Judgment and thought content normal.   Diabetic Foot Form - Detailed   Diabetic Foot Exam - detailed Diabetic Foot exam was performed with the following findings:  Yes 02/07/2018  4:36 PM  Visual Foot Exam completed.:  Yes  Pulse Foot Exam completed.:  Yes  Right Dorsalis Pedis:  Present Left Dorsalis Pedis:  Present  Sensory Foot Exam Completed.:  Yes Semmes-Weinstein Monofilament Test R Site 1-Great Toe:  Pos L Site 1-Great Toe:  Pos          Assessment & Plan:   Problem List Items Addressed This Visit      Cardiovascular and Mediastinum   Hypertension goal BP (blood pressure) < 140/90 (Chronic)    Not to goal; patient declines adding or increasing medicine at this time; will check it at home and notify me if over 130 mmHg; weight loss and DASH guidelines      Relevant Medications   verapamil (VERELAN PM) 240 MG 24 hr capsule   Hot  flashes    Continue SSRI, working well      Relevant Medications   verapamil (VERELAN PM) 240 MG 24 hr capsule     Endocrine   Type 2 diabetes mellitus ([place]) (Chronic)    Check labs today; foot exam by MD        Other   Morbid obesity ([place])    BMI >35 with type 2 DM and hyperlipidemia      Hyperlipidemia LDL goal <100 (Chronic)    Lipids drawn  this morning      Relevant Medications   verapamil (VERELAN PM) 240 MG 24 hr capsule    Other Visit Diagnoses    Screening for breast cancer    -  Primary   Relevant Orders   MM DIGITAL SCREENING BILATERAL       Follow up plan: Return in about 6 months (around 08/10/2018) for follow-up visit with Dr. Sanda Klein.  An after-visit summary was printed and given to the patient at [facility].  Please see the patient instructions which may contain other information and recommendations beyond what is mentioned above in the assessment and plan.  Meds ordered this encounter  Medications  . citalopram (CELEXA) 20 MG tablet    Sig: Take 1 tablet (20 mg total) by mouth daily.    Dispense:  30 tablet    Refill:  11  . verapamil (VERELAN PM) 240 MG 24 hr capsule    Sig: Take 1 capsule (240 mg total) by mouth at bedtime.    Dispense:  30 capsule    Refill:  11    Higher dose, increasing from 180 mg daily to 240 mg daily    Orders Placed This Encounter  Procedures  . MM DIGITAL SCREENING BILATERAL

## 2018-02-07 NOTE — Assessment & Plan Note (Signed)
Lipids drawn this morning

## 2018-02-08 ENCOUNTER — Other Ambulatory Visit: Payer: Self-pay | Admitting: Family Medicine

## 2018-02-08 LAB — CBC WITH DIFFERENTIAL/PLATELET
BASOS ABS: 40 {cells}/uL (ref 0–200)
Basophils Relative: 0.6 %
EOS PCT: 3.7 %
Eosinophils Absolute: 248 cells/uL (ref 15–500)
HEMATOCRIT: 39.5 % (ref 35.0–45.0)
HEMOGLOBIN: 12.6 g/dL (ref 11.7–15.5)
LYMPHS ABS: 2332 {cells}/uL (ref 850–3900)
MCH: 22.2 pg — ABNORMAL LOW (ref 27.0–33.0)
MCHC: 31.9 g/dL — AB (ref 32.0–36.0)
MCV: 69.5 fL — ABNORMAL LOW (ref 80.0–100.0)
MPV: 10 fL (ref 7.5–12.5)
Monocytes Relative: 10.3 %
NEUTROS PCT: 50.6 %
Neutro Abs: 3390 cells/uL (ref 1500–7800)
Platelets: 412 10*3/uL — ABNORMAL HIGH (ref 140–400)
RBC: 5.68 10*6/uL — AB (ref 3.80–5.10)
RDW: 17 % — ABNORMAL HIGH (ref 11.0–15.0)
Total Lymphocyte: 34.8 %
WBC: 6.7 10*3/uL (ref 3.8–10.8)
WBCMIX: 690 {cells}/uL (ref 200–950)

## 2018-02-08 LAB — COMPLETE METABOLIC PANEL WITH GFR
AG RATIO: 1.3 (calc) (ref 1.0–2.5)
ALKALINE PHOSPHATASE (APISO): 124 U/L (ref 33–130)
ALT: 15 U/L (ref 6–29)
AST: 20 U/L (ref 10–35)
Albumin: 4 g/dL (ref 3.6–5.1)
BUN: 16 mg/dL (ref 7–25)
CALCIUM: 9.5 mg/dL (ref 8.6–10.4)
CO2: 32 mmol/L (ref 20–32)
Chloride: 100 mmol/L (ref 98–110)
Creat: 0.8 mg/dL (ref 0.50–1.05)
GFR, Est African American: 99 mL/min/{1.73_m2} (ref >=60)
GFR, Est Non African American: 85 mL/min/{1.73_m2} (ref >=60)
GLOBULIN: 3 g/dL (ref 1.9–3.7)
Glucose, Bld: 109 mg/dL — ABNORMAL HIGH (ref 65–99)
POTASSIUM: 4.3 mmol/L (ref 3.5–5.3)
SODIUM: 139 mmol/L (ref 135–146)
Total Bilirubin: 0.5 mg/dL (ref 0.2–1.2)
Total Protein: 7 g/dL (ref 6.1–8.1)

## 2018-02-08 LAB — HEMOGLOBIN A1C
EAG (MMOL/L): 7.7 (calc)
Hgb A1c MFr Bld: 6.5 % of total Hgb — ABNORMAL HIGH (ref <5.7)
Mean Plasma Glucose: 140 (calc)

## 2018-02-08 LAB — LIPID PANEL
CHOL/HDL RATIO: 4.8 (calc) (ref <5.0)
CHOLESTEROL: 233 mg/dL — AB (ref <200)
HDL: 49 mg/dL — AB (ref >50)
LDL CHOLESTEROL (CALC): 155 mg/dL — AB
NON-HDL CHOLESTEROL (CALC): 184 mg/dL — AB (ref <130)
Triglycerides: 158 mg/dL — ABNORMAL HIGH (ref <150)

## 2018-02-08 LAB — FERRITIN: Ferritin: 39 ng/mL (ref 10–232)

## 2018-02-08 MED ORDER — ATORVASTATIN CALCIUM 20 MG PO TABS: 20.0000 mg | ORAL_TABLET | Freq: Every day | ORAL | 2 refills | Status: DC

## 2018-02-08 NOTE — Progress Notes (Signed)
She presents today date of surgery July 09, 2017 status post [facility] bunionectomy left hammertoe repair #2 #3 #4 #5 of the left foot.  And a Kidner procedure left foot.  She states that she is doing a lot better but still cannot exercise yet but feels like she can go back to physical therapy.  She would like to try this.  Presents in her Cam walker today.  Objective: Vital signs are stable she is alert and oriented x3.  Pulses are strongly palpable left foot.  This is the first time that I seen this foot and is good of condition.  Much decrease in edema no erythema cellulitis drainage or odor she has great range of motion of the first through fifth toes of her left foot.  She still has some tenderness on palpation of the Kidner site.  There is no edema in this area no purulence no malodor no fluctuance within the tendon sheath.  Assessment: Resolving foot surgery.  She does not does still has some residual pain and weakness in the posterior tibial tendon left.  Plan: At this point I will get her back into physical therapy to help strengthen her posterior tibial tendon left and the forefoot is in good condition and ready to go.  We are simply waiting on the posterior tibial tendon at this point.  Referred her to physical therapy in [place].  We will follow-up with her in approximately 1 month she will continue out of work at least until that time.

## 2018-02-08 NOTE — Progress Notes (Signed)
atorv 20

## 2018-03-01 ENCOUNTER — Telehealth: Payer: Self-pay | Admitting: *Deleted

## 2018-03-01 NOTE — Telephone Encounter (Signed)
Natasha Mitchell states she is calling for confirmation of pt's return to work date.

## 2018-03-02 ENCOUNTER — Encounter: Payer: Self-pay | Admitting: Podiatry

## 2018-03-02 ENCOUNTER — Ambulatory Visit: Payer: 59 | Admitting: Podiatry

## 2018-03-02 DIAGNOSIS — M76829 Posterior tibial tendinitis, unspecified leg: Secondary | ICD-10-CM

## 2018-03-02 NOTE — Progress Notes (Signed)
She presents today for follow-up of her surgery to her left foot.  She is still having dull achy pain to the medial aspect of the left foot.  Was unfortunately unable to attend physical therapy because she was terminated from her current position and has lost health insurance at this point.  Objective: Vital signs are stable alert and oriented x3.  Pulses are palpable.  Forefoot is completely normal and rectus and in good shape.  She still has moderate to severe flatfoot deformity but still has tenderness along the medial posterior tibial tendon repair site.  But this is much improved from previous months it is still tender to her.  I evaluated the orthotic that she is currently wearing it appears that her foot really hangs over the orthotic particularly medially.  Assessment: Slowly healing posterior tibial tendon repair forefoot reconstruction.  Plan: At this point I encouraged her to continue some at home physical therapy that she had learned previously.  I also encouraged her to follow-up with Velora Heckler for a set of new orthotics to be made by [employer] [facility].  I feel that the orthotic that she is currently and has 2 superficial of the heel cup and the medial flange for the navicular is too low and needs to come up higher she is still able to pronate over this orthotic.  We will do this at no cost to her.

## 2018-03-04 ENCOUNTER — Encounter: Payer: Self-pay | Admitting: Podiatry

## 2018-03-16 ENCOUNTER — Ambulatory Visit: Payer: 59 | Admitting: Orthotics

## 2018-03-16 ENCOUNTER — Ambulatory Visit: Payer: 59 | Admitting: Podiatry

## 2018-03-16 DIAGNOSIS — M76829 Posterior tibial tendinitis, unspecified leg: Secondary | ICD-10-CM

## 2018-03-16 DIAGNOSIS — M2012 Hallux valgus (acquired), left foot: Secondary | ICD-10-CM

## 2018-03-16 DIAGNOSIS — M722 Plantar fascial fibromatosis: Secondary | ICD-10-CM

## 2018-03-16 NOTE — Progress Notes (Signed)
Need to make f/o deeper with more arch support.  Recasting for [employer]

## 2018-03-30 ENCOUNTER — Encounter: Payer: Self-pay | Admitting: Podiatry

## 2018-03-30 ENCOUNTER — Ambulatory Visit (INDEPENDENT_AMBULATORY_CARE_PROVIDER_SITE_OTHER): Payer: 59 | Admitting: Podiatry

## 2018-03-30 DIAGNOSIS — M76829 Posterior tibial tendinitis, unspecified leg: Secondary | ICD-10-CM

## 2018-03-30 NOTE — Progress Notes (Signed)
She presents today for follow-up of her left ankle surgery.  States that everything is doing fine I just cannot stand on it for very long periods of time is very weak and the endurance is poor.  I really like to be able to go to physical therapy but having lost my job I have no funds.  She states that she is going to apply for Medicaid.  Objective: No real change in physical exam.  Assessment: Well-healing surgical forefoot edema and swelling to the rear foot.  Plan: Provide her with more compression anklet's filled out her paperwork for disability and I will follow-up with her in any way we can to help her.

## 2018-04-13 ENCOUNTER — Ambulatory Visit: Payer: Self-pay | Admitting: Orthotics

## 2018-04-13 DIAGNOSIS — M76829 Posterior tibial tendinitis, unspecified leg: Secondary | ICD-10-CM

## 2018-04-13 DIAGNOSIS — M2012 Hallux valgus (acquired), left foot: Secondary | ICD-10-CM

## 2018-04-13 DIAGNOSIS — M722 Plantar fascial fibromatosis: Secondary | ICD-10-CM

## 2018-04-13 NOTE — Progress Notes (Signed)
Patient came in today to pick up custom made foot orthotics.  The goals were accomplished and the patient reported no dissatisfaction with said orthotics.  Patient was advised of breakin period and how to report any issues. 

## 2018-04-22 ENCOUNTER — Telehealth: Payer: Self-pay | Admitting: Family Medicine

## 2018-04-22 MED ORDER — VERAPAMIL HCL 80 MG PO TABS: 80.0000 mg | ORAL_TABLET | Freq: Three times a day (TID) | ORAL | 5 refills | Status: DC

## 2018-04-22 NOTE — Telephone Encounter (Signed)
Copied from [place] [id number]. Topic: Quick Communication - See Telephone Encounter >> Apr 22, 2018  9:26 AM Synthia Innocent wrote: CRM for notification. See Telephone encounter for: 04/22/18. Patient doesn't have insurance right now and is unable to afford verapamil (VERELAN PM) 240 MG 24 hr capsule, pharmacy told her that tablets are cheaper. Can tablet form be called in, [facility] in [place]

## 2018-04-22 NOTE — Telephone Encounter (Signed)
New Rx sent.

## 2018-04-22 NOTE — Telephone Encounter (Signed)
Left detailed voicemial 

## 2018-04-26 MED ORDER — VERAPAMIL HCL 80 MG PO TABS: 80.0000 mg | ORAL_TABLET | Freq: Three times a day (TID) | ORAL | 5 refills | Status: DC

## 2018-04-26 NOTE — Telephone Encounter (Signed)
Pt states this rx was sent to the wrong pharmacy.  Please re-send rx to:    [facility] [place] [place] [phone] (Phone) [phone] (Fax)   Pt:  [phone]  [phone]

## 2018-04-26 NOTE — Addendum Note (Signed)
Addended by: Matilde Sprang on: 04/26/2018 01:35 PM   Modules accepted: Orders

## 2018-05-02 ENCOUNTER — Other Ambulatory Visit: Payer: Self-pay | Admitting: Family Medicine

## 2018-05-02 DIAGNOSIS — I10 Essential (primary) hypertension: Secondary | ICD-10-CM

## 2018-05-02 NOTE — Telephone Encounter (Signed)
Called pt confirming if she is using new pharmacy because she has refills at the [facility] if so is it truly the [facility] in [place]?

## 2018-05-02 NOTE — Telephone Encounter (Signed)
Copied from [place] [id number]. Topic: Quick Communication - Rx Refill/Question >> May 02, 2018 12:00 PM Oliver Pila B wrote: Medication: hydrochlorothiazide (HYDRODIURIL) 25 MG tablet [[id number]]   Has the patient contacted their pharmacy? Yes.   (Agent: If no, request that the patient contact the pharmacy for the refill.) (Agent: If yes, when and what did the pharmacy advise?)  Preferred Pharmacy (with phone number or street name): [facility]  Agent: Please be advised that RX refills may take up to 3 business days. We ask that you follow-up with your pharmacy.

## 2018-05-03 MED ORDER — HYDROCHLOROTHIAZIDE 25 MG PO TABS: 25.0000 mg | ORAL_TABLET | Freq: Every day | ORAL | 1 refills | Status: AC

## 2018-05-03 NOTE — Telephone Encounter (Signed)
Pt called in and stated she will be using  [facility] ([address]), [place] [phone] (Phone) [phone] (Fax)

## 2018-05-03 NOTE — Telephone Encounter (Signed)
Last Cr and K+ reviewed; Rx approved Thank you for clarifying pharmacy

## 2018-05-25 ENCOUNTER — Encounter: Payer: Self-pay | Admitting: Podiatry

## 2018-05-25 ENCOUNTER — Ambulatory Visit (INDEPENDENT_AMBULATORY_CARE_PROVIDER_SITE_OTHER): Payer: Self-pay | Admitting: Podiatry

## 2018-05-25 DIAGNOSIS — M76829 Posterior tibial tendinitis, unspecified leg: Secondary | ICD-10-CM

## 2018-05-25 NOTE — Progress Notes (Signed)
She presents today for follow-up of her surgical foot date of surgery was October 2018 dates that the orthotics seem to be helping a lot is still swells and is still painful when it does swell but the orthotics are helping considerably.  Objective: Vital signs are stable she is alert and oriented x3 she has some warmth on palpation of the posterior tibial tendon area her toes appear to be healing very nicely they have gone on to heal and have great range of motion.  Just mild tenderness on palpation of the posterior tibial tendon but just only one small area.  The edema has gone down considerably I can actually see anatomy at this point the medial aspect of the foot and ankle.  Assessment: Slowly resolving posterior tibial tendon repair.  Plan: At this point we will continue to watch her I encouraged her to continue to wear her brace and tennis shoes and orthotics regularly also encouraged to Aleve tablets once in the morning once in the evening and to do that for a week on and a week off until I see her again I will follow-up with her in about 2 months we will continue to charge her the very least we can to help her out.

## 2018-07-06 ENCOUNTER — Ambulatory Visit (INDEPENDENT_AMBULATORY_CARE_PROVIDER_SITE_OTHER): Payer: Self-pay | Admitting: Podiatry

## 2018-07-06 ENCOUNTER — Encounter: Payer: Self-pay | Admitting: Podiatry

## 2018-07-06 DIAGNOSIS — M76829 Posterior tibial tendinitis, unspecified leg: Secondary | ICD-10-CM

## 2018-07-06 NOTE — Progress Notes (Signed)
She presents today for follow-up of her posterior tibial tendinitis.  She states that the foot does pretty well as long as she can wear her orthotics however she did try some exercise and she states that is just not ready for exercise and still swells horribly by the end of the day so she has to wear her compression dressings.  Still states that is significant pain.  Objective: Vital signs are stable alert and oriented x3.  Pulses are palpable.  Forefoot surgery has gone on to heal uneventfully and looks perfect however she still has swelling on the medial aspect of the left foot.  She is strong of the posterior tibial tendon however the endurance is lacking.  There is mild tenderness on palpation to this area.  Assessment: Posterior tibial tendinitis still present after posterior tibial tendon repair with edema.  Low endurance and still painful.  Forefoot has gone to heal uneventfully.  Plan: She is to continue out of work continue physical therapy and continue to progress ahead with exercise.  She continues to need to keep the foot elevated as much as possible when sitting range of motion exercises massage therapy and very low impact activities.

## 2018-08-16 ENCOUNTER — Encounter: Payer: 59 | Admitting: Family Medicine

## 2018-08-24 ENCOUNTER — Telehealth: Payer: Self-pay | Admitting: *Deleted

## 2018-08-24 NOTE — Telephone Encounter (Signed)
Fine with me but we need to put jackie's name on it before it leaves the office.  Im not sure that she will be able to pick it up from the [facility] for her sister.  They usually require NCDL

## 2018-08-24 NOTE — Telephone Encounter (Signed)
Patient notified via voice mail that handicap sticker is ready for pick up at front desk

## 2018-08-24 NOTE — Telephone Encounter (Signed)
Pt states she needs another handicap sticker and she is out of town and has no intentions of coming in to get it, she will have her sister pick it up.

## 2018-10-10 ENCOUNTER — Encounter: Payer: Self-pay | Admitting: Podiatry

## 2018-10-10 ENCOUNTER — Ambulatory Visit (INDEPENDENT_AMBULATORY_CARE_PROVIDER_SITE_OTHER): Payer: Self-pay | Admitting: Podiatry

## 2018-10-10 DIAGNOSIS — M76829 Posterior tibial tendinitis, unspecified leg: Secondary | ICD-10-CM

## 2018-10-10 NOTE — Progress Notes (Signed)
She presents today for follow-up of her posterior tibial tendon repair of her left foot.  She states that seem to be doing a lot better though it is not where it needs to be for me to be able to obtain a job and hold a job.  At this point she will soon be obtaining insurance so that we may be able to strengthen her posterior tibial tendon via physical therapy.  Objective: Vital signs are stable she alert and oriented x3 she has no fluid on palpation of the posterior tibial tendon.  Good inversion against resistance.  No pain.  Assessment: Pes planus left.  Posterior tibial tendon dysfunction with status post posterior tibial tendon repair and Kidner procedure.  Mild pes planus with weak foot.  Plan: Continue to use orthotics and she will start exercise physical therapy to help with strengthening and balance and stamina.  I will follow-up with her once that has been completed probably after about a month

## 2018-11-11 ENCOUNTER — Encounter: Payer: Self-pay | Admitting: Podiatry

## 2018-11-14 NOTE — Telephone Encounter (Signed)
Please advise on this.  I will be happy to refer her to [facility] if you agree.  Thanks!

## 2018-11-17 DIAGNOSIS — R7303 Prediabetes: Secondary | ICD-10-CM | POA: Insufficient documentation

## 2018-11-17 DIAGNOSIS — H538 Other visual disturbances: Secondary | ICD-10-CM | POA: Insufficient documentation

## 2019-03-14 ENCOUNTER — Other Ambulatory Visit: Payer: Self-pay

## 2019-03-14 ENCOUNTER — Ambulatory Visit (INDEPENDENT_AMBULATORY_CARE_PROVIDER_SITE_OTHER): Payer: Self-pay | Admitting: Podiatry

## 2019-03-14 ENCOUNTER — Encounter: Payer: Self-pay | Admitting: Podiatry

## 2019-03-14 VITALS — Temp 97.3°F

## 2019-03-14 DIAGNOSIS — M76829 Posterior tibial tendinitis, unspecified leg: Secondary | ICD-10-CM

## 2019-03-14 NOTE — Progress Notes (Signed)
She presents today for follow-up of her posterior tibial tendon dysfunction and the surgery that was performed to repair the posterior tibial tendon.  She states that she seems to be doing better but she still gets some severe swelling and pain occasionally in the ankle and foot.  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  Objective: Vital signs are stable she is alert and oriented x3.  Pulses are palpable.  Neurologic sensorium is intact.  dT reflexes are intact.  Muscle strength is normal symmetrical.  She has mild edema about the posterior lateral and medial aspect of the left foot.  She has tenderness on palpation of the inferior navicular area along the plantar fascia.  She has very good inversion against resistance of the left foot.  And forefoot is in perfect alignment.  Assessment: Well-healing surgical foot but she still has some limitations after being on the foot for a period of time as she does have some plantar fasciitis centrally located beneath the navicular.  Plan: At this point I injected the area with Kenalog and local anesthetic.  She tolerated procedure well.  I would allow her to go back to work in a seated capacity limiting her ambulation and limiting lifting of heavy objects.  I will follow-up with her on an as-needed basis

## 2019-04-06 DIAGNOSIS — N898 Other specified noninflammatory disorders of vagina: Secondary | ICD-10-CM | POA: Insufficient documentation

## 2019-04-06 DIAGNOSIS — R319 Hematuria, unspecified: Secondary | ICD-10-CM | POA: Insufficient documentation

## 2019-06-01 ENCOUNTER — Telehealth: Payer: Self-pay | Admitting: Podiatry

## 2019-06-01 NOTE — Telephone Encounter (Signed)
Neoma Laming with [employer] called to say she did not receive all the pages via fax of patients medical records. Requested they be re-faxed to [phone].

## 2019-06-06 DIAGNOSIS — M79676 Pain in unspecified toe(s): Secondary | ICD-10-CM

## 2019-06-08 DIAGNOSIS — R5383 Other fatigue: Secondary | ICD-10-CM | POA: Insufficient documentation

## 2019-06-08 DIAGNOSIS — K5900 Constipation, unspecified: Secondary | ICD-10-CM | POA: Insufficient documentation

## 2020-06-11 DIAGNOSIS — M79676 Pain in unspecified toe(s): Secondary | ICD-10-CM

## 2021-09-12 DIAGNOSIS — B369 Superficial mycosis, unspecified: Secondary | ICD-10-CM | POA: Insufficient documentation

## 2023-05-06 ENCOUNTER — Encounter: Payer: Self-pay | Admitting: Podiatry

## 2023-05-06 ENCOUNTER — Ambulatory Visit (INDEPENDENT_AMBULATORY_CARE_PROVIDER_SITE_OTHER): Payer: 59

## 2023-05-06 ENCOUNTER — Other Ambulatory Visit: Payer: Self-pay | Admitting: Podiatry

## 2023-05-06 ENCOUNTER — Ambulatory Visit (INDEPENDENT_AMBULATORY_CARE_PROVIDER_SITE_OTHER): Payer: 59 | Admitting: Podiatry

## 2023-05-06 DIAGNOSIS — M2042 Other hammer toe(s) (acquired), left foot: Secondary | ICD-10-CM

## 2023-05-06 DIAGNOSIS — M79672 Pain in left foot: Secondary | ICD-10-CM

## 2023-05-06 DIAGNOSIS — T847XXA Infection and inflammatory reaction due to other internal orthopedic prosthetic devices, implants and grafts, initial encounter: Secondary | ICD-10-CM

## 2023-05-06 DIAGNOSIS — Z86010 Personal history of colonic polyps: Secondary | ICD-10-CM | POA: Insufficient documentation

## 2023-05-06 NOTE — Progress Notes (Signed)
Subjective:  Patient ID: Natasha Mitchell, female    DOB: 04/30/1966,  MRN: [id number] HPI Chief Complaint  Patient presents with   Toe Pain    4th toe left - previous HT surgery with screws, tender at the tip for about 8 months, getting callusing on the others as well (2nd and 3rd)   New Patient (Initial Visit)    Est pt 2020    57 y.o. female presents with the above complaint.   ROS: Denies fever chills nausea vomit muscle aches pains calf pain back pain chest pain shortness of breath.  States that her left foot is doing really well after we repaired her tendon.  She states they fell on her fourth toe left foot where screw was placed is very painful has caused her toenails are thickened and become discolored and exquisitely painful.  States that she cannot wear closed shoes.  Past Medical History:  Diagnosis Date   Anemia    H/O PRIOR TO HYSTERECTOMY   Anxiety    Arthritis    Family hx of colon cancer 02/15/2016   Headache    History of uterine fibroid    s/p abdominal hysterectomy with LSO and bilateral salpingectomy 03/2016   Hypertension    Intermittent palpitations    SINCE TAKING VERAPAMIL AND DECREASING CAFFEINE INTAKE, PALITATIONS ARE VERY MINIMAL PER PT (02-03-17)   Prediabetes    Type 2 diabetes mellitus (HCC) 07/06/2017   Vein, varicose 2016   Past Surgical History:  Procedure Laterality Date   ABDOMINAL HYSTERECTOMY  04/26/2016   ABLATION SAPHENOUS VEIN W/ RFA Bilateral    femoral vein ([facility])   BUNIONECTOMY Left 07/09/2017   COLONIC ADENOMA     HIGH GRADE DYSPLASIA   COLONOSCOPY WITH PROPOFOL N/A 02/11/2016   Procedure: COLONOSCOPY WITH PROPOFOL;  Surgeon: Christena Deem, MD;  Location: [facility] ENDOSCOPY;  Service: Endoscopy;  Laterality: N/A;   FLEXIBLE SIGMOIDOSCOPY N/A 05/07/2017   Procedure: FLEXIBLE SIGMOIDOSCOPY;  Surgeon: Christena Deem, MD;  Location: [facility] ENDOSCOPY;  Service: Endoscopy;  Laterality: N/A;   HAMMER TOE  SURGERY Left 07/09/2017   HYSTERECTOMY ABDOMINAL WITH SALPINGECTOMY Bilateral 04/06/2016   Procedure: HYSTERECTOMY ABDOMINAL WITH BILATERAL SALPINGECTOMY;  Surgeon: Hildred Laser, MD;  Location: [facility] ORS;  Service: Gynecology;  Laterality: Bilateral;   LIPOMA EXCISION Right 02/18/2017   Procedure: EXCISION LIPOMA ON MID BACK;  Surgeon: Ricarda Frame, MD;  Location: [facility] ORS;  Service: General;  Laterality: Right;   MOUTH SURGERY  2001   TUBAL LIGATION  1998    Current Outpatient Medications:    Aspirin-Acetaminophen-Caffeine (EXCEDRIN PO), Take 2 tablets by mouth daily as needed (PAIN)., Disp: , Rfl:    atorvastatin (LIPITOR) 20 MG tablet, Take 1 tablet (20 mg total) by mouth at bedtime., Disp: 30 tablet, Rfl: 2   Biotin 5000 MCG CAPS, Take 1 capsule by mouth daily., Disp: , Rfl:    Cholecalciferol (VITAMIN D3 ADULT GUMMIES PO), Take 2 tablets by mouth daily. 500 UNITS EACH CHEW, Disp: , Rfl:    citalopram (CELEXA) 20 MG tablet, Take 1 tablet (20 mg total) by mouth daily., Disp: 30 tablet, Rfl: 11   glucose blood (TRUE METRIX BLOOD GLUCOSE TEST) test strip, Use as instructed, check FSBS daily as desired; ICD-10 code R73.01; LON 6 months, Disp: 100 each, Rfl: 1   hydrochlorothiazide (HYDRODIURIL) 25 MG tablet, Take 1 tablet (25 mg total) by mouth daily., Disp: 90 tablet, Rfl: 1   Multiple Vitamin (MULTIVITAMIN) tablet, Take 1 tablet  by mouth daily. , Disp: , Rfl:    Omega-3 Fatty Acids (FISH OIL PO), Take 1,000 mg by mouth daily. , Disp: , Rfl:    Pediatric Multiple Vit-C-FA (CHEWABLE VITE CHILDRENS) CHEW, Chew by mouth., Disp: , Rfl:    polyethylene glycol (MIRALAX / GLYCOLAX) packet, Take 17 g by mouth daily as needed for mild constipation. , Disp: , Rfl:    tetrahydrozoline-zinc (VISINE-AC) 0.05-0.25 % ophthalmic solution, Place 3-4 drops into both eyes 2 (two) times daily. Depends on irritation if 3-4 drops, Disp: , Rfl:    verapamil (CALAN) 80 MG tablet, Take 1 tablet (80 mg total) by mouth  3 (three) times daily. (replaces once a day verelan PM), Disp: 90 tablet, Rfl: 5   vitamin E 400 UNIT capsule, Take 400 Units by mouth daily. , Disp: , Rfl:   Allergies  Allergen Reactions   Delsym Cough Relief [Dextromethorphan-Menthol] Shortness Of Breath   Dextromethorphan Shortness Of Breath   Other Hives    Imitation Crab Meat Imitation Crab Meat Imitation Crab Meat   Latex Rash   Motrin [Ibuprofen] Rash   Mushroom Extract Complex Nausea Only and Palpitations   Sulfa Antibiotics Nausea Only and Palpitations   Review of Systems Objective:  There were no vitals filed for this visit.  General: Well developed, nourished, in no acute distress, alert and oriented x3   Dermatological: Skin is warm, dry and supple bilateral. Nails x 10 are well maintained; remaining integument appears unremarkable at this time. There are no open sores, no preulcerative lesions, no rash or signs of infection present.  She does demonstrate a painful lesion to the dorsal lateral aspect of her fifth toe left foot this is most likely associated with her previous surgery.  Appears to be a porokeratotic lesion at the end of her scar.  Nonulcerative  Vascular: Dorsalis Pedis artery and Posterior Tibial artery pedal pulses are 2/4 bilateral with immedate capillary fill time. Pedal hair growth present. No varicosities and no lower extremity edema present bilateral.   Neruologic: Grossly intact via light touch bilateral. Vibratory intact via tuning fork bilateral. Protective threshold with Semmes Wienstein monofilament intact to all pedal sites bilateral. Patellar and Achilles deep tendon reflexes 2+ bilateral. No Babinski or clonus noted bilateral.   Musculoskeletal: No gross boney pedal deformities bilateral. No pain, crepitus, or limitation noted with foot and ankle range of motion bilateral. Muscular strength 5/5 in all groups tested bilateral.  Gait: Unassisted, Nonantalgic.    Radiographs:  Radiographs  taken today demonstrate osseously mature with good bone mineralization.  She has digital screws into toes #2 #3 #4 left foot.  She also has some retrograding of the fourth but the other 2 screws appear to be intact and in good position. Assessment & Plan:   Assessment: Painful screw/internal fixation fourth digit left foot.  Painful skin lesion fifth digit left foot dorsally  Plan: Discussed etiology pathology conservative versus surgical therapies.  At this point we consented her for excision of soft tissue lesion and removal of screw fourth toe left foot.  We did discuss the possible side effects complications that are associated with this procedure she understands and is amenable to it.     Johanan Skorupski T. [place], [place]

## 2023-05-10 ENCOUNTER — Telehealth: Payer: Self-pay | Admitting: Podiatry

## 2023-05-10 NOTE — Telephone Encounter (Signed)
SHATYRA, CRIOLLO Patient Member ID [id number]  Date of Birth 02-Dec-1965  Gender Female  Eligibility Status Active Coverage  Group Number [id number]  Plan / Coverage Date 2021-09-11  Transaction Type Outpatient Authorization  Organization [facility] [employer]  Payer [employer] (COMMERCIAL & MEDICARE)  Armed forces training and education officer ID: Not FoundCustomer ID: [id number] Transaction Date: NA No Authorization Required Place of Service [facility]  Service From - To Date NA  Admission Type 9  Diagnosis Code 1 T847XXA - Infect/inflm react due to oth int orth prosth dev/grft init  Procedure Code 1 78469  Quantity 1 Units  Procedure From - To Date 2023-05-14  Status NO AUTH REQUIRED  Message No precert required. The requested service may not be eligible for coverage . Refer to the online Clinical Policy Bulletins or the Provider Code Search Tool on [employer]. You may also contact provider services using the Precert number on the member id card. [id number]  Procedure Code 2 62952  Quantity 1 Units  Procedure From - To Date 2023-05-14  Status NO AUTH REQUIRED  Message No precert required. The requested service may not be eligible for coverage . Refer to the online Clinical Policy Bulletins or the Provider Code Search Tool on [employer]. You may also contact provider services using the Precert number on the member id card. [id number]

## 2023-05-11 ENCOUNTER — Ambulatory Visit: Payer: 59 | Admitting: Podiatry

## 2023-05-12 ENCOUNTER — Telehealth: Payer: Self-pay | Admitting: Podiatry

## 2023-05-12 ENCOUNTER — Other Ambulatory Visit: Payer: Self-pay

## 2023-05-12 ENCOUNTER — Other Ambulatory Visit: Payer: Self-pay | Admitting: Podiatry

## 2023-05-12 MED ORDER — CEPHALEXIN 500 MG PO CAPS
500.0000 mg | ORAL_CAPSULE | Freq: Three times a day (TID) | ORAL | 0 refills | Status: DC
  Filled 2023-05-12: qty 30, 10d supply, fill #0

## 2023-05-12 MED ORDER — ONDANSETRON HCL 4 MG PO TABS: 4.0000 mg | ORAL_TABLET | Freq: Three times a day (TID) | ORAL | 0 refills | Status: DC | PRN

## 2023-05-12 MED ORDER — TRAMADOL HCL 50 MG PO TABS: 50.0000 mg | ORAL_TABLET | Freq: Three times a day (TID) | ORAL | 0 refills | Status: AC | PRN

## 2023-05-12 MED ORDER — ONDANSETRON HCL 4 MG PO TABS
4.0000 mg | ORAL_TABLET | Freq: Three times a day (TID) | ORAL | 0 refills | Status: DC | PRN
  Filled 2023-05-12: qty 9, 3d supply, fill #0

## 2023-05-12 MED ORDER — TRAMADOL HCL 50 MG PO TABS
50.0000 mg | ORAL_TABLET | Freq: Three times a day (TID) | ORAL | 0 refills | Status: AC | PRN
  Filled 2023-05-12: qty 15, 5d supply, fill #0

## 2023-05-12 MED ORDER — CEPHALEXIN 500 MG PO CAPS: 500.0000 mg | ORAL_CAPSULE | Freq: Three times a day (TID) | ORAL | 0 refills | Status: DC

## 2023-05-12 NOTE — Telephone Encounter (Signed)
Pt stated that Rxs were sent to the wrong pharmacy. I have updated the pharmacy in pt's chart. She wants Rxs sent to the corrected location. Please advise

## 2023-05-14 ENCOUNTER — Telehealth: Payer: Self-pay | Admitting: Podiatry

## 2023-05-14 ENCOUNTER — Other Ambulatory Visit: Payer: Self-pay

## 2023-05-14 ENCOUNTER — Other Ambulatory Visit: Payer: Self-pay | Admitting: *Deleted

## 2023-05-14 ENCOUNTER — Other Ambulatory Visit: Payer: Self-pay | Admitting: Podiatry

## 2023-05-14 DIAGNOSIS — Z4889 Encounter for other specified surgical aftercare: Secondary | ICD-10-CM | POA: Diagnosis not present

## 2023-05-14 DIAGNOSIS — D2372 Other benign neoplasm of skin of left lower limb, including hip: Secondary | ICD-10-CM | POA: Diagnosis not present

## 2023-05-14 MED ORDER — CEPHALEXIN 500 MG PO CAPS: 500.0000 mg | ORAL_CAPSULE | Freq: Three times a day (TID) | ORAL | 0 refills | Status: DC

## 2023-05-14 MED ORDER — OXYCODONE-ACETAMINOPHEN 10-325 MG PO TABS: 1.0000 | ORAL_TABLET | ORAL | 0 refills | Status: DC | PRN

## 2023-05-14 MED ORDER — CEPHALEXIN 500 MG PO CAPS: 500.0000 mg | ORAL_CAPSULE | Freq: Four times a day (QID) | ORAL | 0 refills | Status: DC

## 2023-05-14 MED ORDER — HYDROCODONE-ACETAMINOPHEN 5-325 MG PO TABS: 1.0000 | ORAL_TABLET | Freq: Four times a day (QID) | ORAL | 0 refills | Status: DC | PRN

## 2023-05-14 NOTE — Telephone Encounter (Signed)
Pt can take this pain medication, she would like something different pt just had surgery yesterday, pt had a reaction to the pain medication

## 2023-05-14 NOTE — Addendum Note (Signed)
Addended by: Nicholes Rough on: 05/14/2023 12:16 PM   Modules accepted: Orders

## 2023-05-19 ENCOUNTER — Ambulatory Visit (INDEPENDENT_AMBULATORY_CARE_PROVIDER_SITE_OTHER): Payer: 59 | Admitting: Podiatry

## 2023-05-19 ENCOUNTER — Encounter: Payer: Self-pay | Admitting: Podiatry

## 2023-05-19 ENCOUNTER — Ambulatory Visit (INDEPENDENT_AMBULATORY_CARE_PROVIDER_SITE_OTHER): Payer: 59

## 2023-05-19 DIAGNOSIS — T847XXA Infection and inflammatory reaction due to other internal orthopedic prosthetic devices, implants and grafts, initial encounter: Secondary | ICD-10-CM

## 2023-05-19 NOTE — Progress Notes (Signed)
Subjective:   Patient ID: Natasha Mitchell, female   DOB: 57 y.o.   MRN: [id number]   HPI Patient presents stating doing well after having removal of screw left fourth digit and removal of small cyst   ROS      Objective:  Physical Exam  Neurovascular status intact negative Denna Haggard' sign noted wound edges well coapted stitches intact minimal edema     Assessment:  Doing well post removal of screw from left fourth digit     Plan:  H&P x-ray reviewed sterile dressing reapplied continue open toed shoes reappoint stitch removal Dr. Al Corpus  X-rays indicate satisfactory removal of screw good alignment of the digit no

## 2023-05-20 ENCOUNTER — Encounter: Payer: 59 | Admitting: Podiatry

## 2023-05-20 ENCOUNTER — Encounter: Payer: Self-pay | Admitting: Podiatry

## 2023-05-21 ENCOUNTER — Other Ambulatory Visit: Payer: Self-pay

## 2023-06-02 ENCOUNTER — Encounter: Payer: Self-pay | Admitting: Podiatry

## 2023-06-02 ENCOUNTER — Ambulatory Visit (INDEPENDENT_AMBULATORY_CARE_PROVIDER_SITE_OTHER): Payer: 59 | Admitting: Podiatry

## 2023-06-02 DIAGNOSIS — Z9889 Other specified postprocedural states: Secondary | ICD-10-CM

## 2023-06-02 DIAGNOSIS — T847XXA Infection and inflammatory reaction due to other internal orthopedic prosthetic devices, implants and grafts, initial encounter: Secondary | ICD-10-CM

## 2023-06-02 NOTE — Progress Notes (Signed)
Natasha Mitchell presents today for her second postop visit date of surgery 05/14/2019 for removal internal fixation of screw fourth toe.  She states that seems to be doing pretty good have not had a lot of pain with it at all.  Objective: Vital signs are stable alert oriented x 3 fourth fifth digit of the left foot demonstrated well-healing surgical toes fourth and fifth sutures are intact they were removed today margins remain well coapted.  Assessment: I feel that she is doing quite well with her procedure.  Plan: She is going to wrap the the 2 toes with Coban which I provided her and we will start letting her wash them tomorrow.  I recommended that she stay in her Darco shoe or an open toed shoe for now.

## 2023-06-03 ENCOUNTER — Encounter: Payer: 59 | Admitting: Podiatry

## 2023-06-22 DIAGNOSIS — I479 Paroxysmal tachycardia, unspecified: Secondary | ICD-10-CM | POA: Insufficient documentation

## 2023-06-23 ENCOUNTER — Encounter: Payer: 59 | Admitting: Podiatry

## 2023-06-24 ENCOUNTER — Encounter: Payer: 59 | Admitting: Podiatry

## 2023-06-30 ENCOUNTER — Ambulatory Visit (INDEPENDENT_AMBULATORY_CARE_PROVIDER_SITE_OTHER): Payer: 59 | Admitting: Podiatry

## 2023-06-30 ENCOUNTER — Encounter: Payer: Self-pay | Admitting: Podiatry

## 2023-06-30 DIAGNOSIS — T847XXA Infection and inflammatory reaction due to other internal orthopedic prosthetic devices, implants and grafts, initial encounter: Secondary | ICD-10-CM

## 2023-06-30 DIAGNOSIS — Z9889 Other specified postprocedural states: Secondary | ICD-10-CM

## 2023-06-30 NOTE — Progress Notes (Signed)
Natasha Mitchell presents today for removal of internal fixation screw fourth toe left foot.  She states is doing great and back in shoes and very happy with outcome she is excited to not have that pain any longer.  States that she was in the hospital recently for some heart issue that she really does not know what was going on with.  Objective: Vitals are stable alert oriented x 3.  Pulses are palpable.  Toe is going on to heal very nicely there is no erythema cellulitis drainage odor no signs of infection.  Assessment: Well-healing surgical foot.  Plan: Follow-up with me on an as-needed basis.
